# Patient Record
Sex: Male | Born: 1955
Health system: Southern US, Community
[De-identification: ages and names within clinical notes are randomized; demographics above are authoritative.]

## PROBLEM LIST (undated history)

## (undated) DIAGNOSIS — F32A Depression, unspecified: Secondary | ICD-10-CM

## (undated) DIAGNOSIS — F329 Major depressive disorder, single episode, unspecified: Secondary | ICD-10-CM

## (undated) DIAGNOSIS — I10 Essential (primary) hypertension: Secondary | ICD-10-CM

## (undated) DIAGNOSIS — M1990 Unspecified osteoarthritis, unspecified site: Secondary | ICD-10-CM

## (undated) HISTORY — PX: COLON RESECTION: SHX5231

## (undated) HISTORY — PX: JOINT REPLACEMENT: SHX530

## (undated) HISTORY — PX: APPENDECTOMY: SHX54

---

## 2010-05-15 ENCOUNTER — Ambulatory Visit (HOSPITAL_COMMUNITY): Admission: RE | Admit: 2010-05-15 | Discharge: 2010-05-15 | Payer: Self-pay | Admitting: Psychiatry

## 2010-05-20 ENCOUNTER — Other Ambulatory Visit (HOSPITAL_COMMUNITY): Admission: RE | Admit: 2010-05-20 | Discharge: 2010-07-08 | Payer: Self-pay | Admitting: Psychiatry

## 2010-06-27 ENCOUNTER — Ambulatory Visit: Payer: Self-pay | Admitting: Psychiatry

## 2010-12-20 LAB — URINE DRUGS OF ABUSE SCREEN W ALC, ROUTINE (REF LAB)
Barbiturate Quant, Ur: NEGATIVE
Benzodiazepines.: POSITIVE — AB
Cocaine Metabolites: NEGATIVE
Creatinine,U: 77.8 mg/dL
Ethyl Alcohol: <10 mg/dL (ref <10)
Phencyclidine (PCP): NEGATIVE

## 2010-12-20 LAB — BENZODIAZEPINE, QUANTITATIVE, URINE
Nordiazepam GC/MS Conf: NEGATIVE NG/ML
Temazepam GC/MS Conf: NEGATIVE NG/ML

## 2011-04-03 ENCOUNTER — Emergency Department (INDEPENDENT_AMBULATORY_CARE_PROVIDER_SITE_OTHER): Payer: BC Managed Care – PPO

## 2011-04-03 ENCOUNTER — Emergency Department (HOSPITAL_BASED_OUTPATIENT_CLINIC_OR_DEPARTMENT_OTHER)
Admission: EM | Admit: 2011-04-03 | Discharge: 2011-04-03 | Disposition: A | Discharge status: Home / Self Care | Payer: BC Managed Care – PPO | Attending: Emergency Medicine | Admitting: Emergency Medicine

## 2011-04-03 DIAGNOSIS — I10 Essential (primary) hypertension: Secondary | ICD-10-CM | POA: Insufficient documentation

## 2011-04-03 DIAGNOSIS — E119 Type 2 diabetes mellitus without complications: Secondary | ICD-10-CM | POA: Insufficient documentation

## 2011-04-03 DIAGNOSIS — R109 Unspecified abdominal pain: Secondary | ICD-10-CM | POA: Insufficient documentation

## 2011-04-03 DIAGNOSIS — E669 Obesity, unspecified: Secondary | ICD-10-CM | POA: Insufficient documentation

## 2011-04-03 DIAGNOSIS — F3289 Other specified depressive episodes: Secondary | ICD-10-CM | POA: Insufficient documentation

## 2011-04-03 DIAGNOSIS — K7689 Other specified diseases of liver: Secondary | ICD-10-CM

## 2011-04-03 DIAGNOSIS — R1011 Right upper quadrant pain: Secondary | ICD-10-CM

## 2011-04-03 DIAGNOSIS — F329 Major depressive disorder, single episode, unspecified: Secondary | ICD-10-CM | POA: Insufficient documentation

## 2011-04-03 LAB — URINALYSIS, ROUTINE W REFLEX MICROSCOPIC
Bilirubin Urine: NEGATIVE
Glucose, UA: NEGATIVE mg/dL
Hgb urine dipstick: NEGATIVE
Protein, ur: NEGATIVE mg/dL
Urobilinogen, UA: 0.2 mg/dL (ref 0.0–1.0)

## 2011-04-03 LAB — COMPREHENSIVE METABOLIC PANEL
ALT: 27 U/L (ref 0–53)
AST: 23 U/L (ref 0–37)
Alkaline Phosphatase: 80 U/L (ref 39–117)
CO2: 22 mEq/L (ref 19–32)
Chloride: 101 mEq/L (ref 96–112)
GFR calc Af Amer: >60 mL/min (ref >60)
GFR calc non Af Amer: >60 mL/min (ref >60)
Glucose, Bld: 105 mg/dL — ABNORMAL HIGH (ref 70–99)
Potassium: 4 mEq/L (ref 3.5–5.1)
Sodium: 135 mEq/L (ref 135–145)
Total Bilirubin: 0.5 mg/dL (ref 0.3–1.2)

## 2011-04-03 LAB — CBC
Hemoglobin: 16.7 g/dL (ref 13.0–17.0)
MCH: 29.4 pg (ref 26.0–34.0)
Platelets: 213 10*3/uL (ref 150–400)
RBC: 5.68 MIL/uL (ref 4.22–5.81)
WBC: 13.3 10*3/uL — ABNORMAL HIGH (ref 4.0–10.5)

## 2011-04-03 LAB — DIFFERENTIAL
Basophils Absolute: 0 10*3/uL (ref 0.0–0.1)
Basophils Relative: 0 % (ref 0–1)
Eosinophils Absolute: 0.3 10*3/uL (ref 0.0–0.7)
Monocytes Relative: 10 % (ref 3–12)
Neutro Abs: 10.4 10*3/uL — ABNORMAL HIGH (ref 1.7–7.7)
Neutrophils Relative %: 78 % — ABNORMAL HIGH (ref 43–77)

## 2011-04-03 MED ORDER — IOHEXOL 300 MG/ML  SOLN
100.0000 mL | Freq: Once | INTRAMUSCULAR | Status: AC | PRN
  Administered 2011-04-03: 100 mL via INTRAVENOUS

## 2011-08-13 ENCOUNTER — Emergency Department (HOSPITAL_BASED_OUTPATIENT_CLINIC_OR_DEPARTMENT_OTHER)
Admission: EM | Admit: 2011-08-13 | Discharge: 2011-08-13 | Disposition: A | Discharge status: Home / Self Care | Payer: BC Managed Care – PPO | Attending: Emergency Medicine | Admitting: Emergency Medicine

## 2011-08-13 ENCOUNTER — Encounter: Payer: Self-pay | Admitting: *Deleted

## 2011-08-13 DIAGNOSIS — S61209A Unspecified open wound of unspecified finger without damage to nail, initial encounter: Secondary | ICD-10-CM | POA: Insufficient documentation

## 2011-08-13 DIAGNOSIS — X58XXXA Exposure to other specified factors, initial encounter: Secondary | ICD-10-CM | POA: Insufficient documentation

## 2011-08-13 DIAGNOSIS — F17200 Nicotine dependence, unspecified, uncomplicated: Secondary | ICD-10-CM | POA: Insufficient documentation

## 2011-08-13 DIAGNOSIS — S61219A Laceration without foreign body of unspecified finger without damage to nail, initial encounter: Secondary | ICD-10-CM

## 2011-08-13 DIAGNOSIS — I10 Essential (primary) hypertension: Secondary | ICD-10-CM | POA: Insufficient documentation

## 2011-08-13 HISTORY — DX: Essential (primary) hypertension: I10

## 2011-08-13 MED ORDER — TETANUS-DIPHTH-ACELL PERTUSSIS 5-2.5-18.5 LF-MCG/0.5 IM SUSP
INTRAMUSCULAR | Status: AC
  Filled 2011-08-13: qty 0.5

## 2011-08-13 MED ORDER — "THROMBI-PAD 3""X3"" EX PADS"
1.0000 | MEDICATED_PAD | Freq: Once | CUTANEOUS | Status: AC
  Administered 2011-08-13: 1 via TOPICAL

## 2011-08-13 MED ORDER — "THROMBI-PAD 3""X3"" EX PADS"
MEDICATED_PAD | CUTANEOUS | Status: AC
  Filled 2011-08-13: qty 1

## 2011-08-13 MED ORDER — TETANUS-DIPHTH-ACELL PERTUSSIS 5-2.5-18.5 LF-MCG/0.5 IM SUSP
0.5000 mL | Freq: Once | INTRAMUSCULAR | Status: AC
  Administered 2011-08-13: 0.5 mL via INTRAMUSCULAR

## 2011-08-13 MED ORDER — HYDROCODONE-ACETAMINOPHEN 5-325 MG PO TABS: 2.0000 | ORAL_TABLET | ORAL | Status: AC | PRN

## 2011-08-13 NOTE — ED Provider Notes (Signed)
History     CSN: [id number] Arrival date & time: 08/13/2011  6:25 PM   First MD Initiated Contact with Patient 08/13/11 1832      Chief Complaint  Patient presents with  . Laceration    (Consider location/radiation/quality/duration/timing/severity/associated sxs/prior treatment) Patient is a 55 y.o. male presenting with skin laceration. The history is provided by the patient. No language interpreter was used.  Laceration  The incident occurred 6 to 12 hours ago. The laceration is located on the right hand. The laceration is 1 cm in size. The laceration mechanism was a a metal edge. The pain is at a severity of 4/10. The pain is moderate. The pain has been constant since onset. He reports no foreign bodies present. His tetanus status is out of date.  Pt cut tip of finger off with a potato slicer. Pt complains of continued bleeding.  Past Medical History  Diagnosis Date  . Hypertension     Past Surgical History  Procedure Date  . Colon resection   . Appendectomy   . Joint replacement     History reviewed. No pertinent family history.  History  Substance Use Topics  . Smoking status: Current Everyday Smoker -- 1.0 packs/day  . Smokeless tobacco: Not on file  . Alcohol Use: No      Review of Systems  Skin: Positive for wound.  All other systems reviewed and are negative.    Allergies  Morphine and related and Sulfa antibiotics  Home Medications   Current Outpatient Rx  Name Route Sig Dispense Refill  . FLUOXETINE HCL 40 MG PO CAPS Oral Take 40 mg by mouth daily.      . IBUPROFEN 200 MG PO TABS Oral Take 600 mg by mouth every 6 (six) hours as needed. For pain     . LISINOPRIL PO Oral Take 1 tablet by mouth daily.      Marland Kitchen OXYMETAZOLINE HCL 0.05 % NA SOLN Nasal Place 2 sprays into the nose 2 (two) times daily as needed.        BP 154/91  Pulse 73  Temp(Src) 97.9 F (36.6 C) (Oral)  Resp 16  Ht 5\' 11"  (1.803 m)  Wt 350 lb (158.759 kg)  BMI 48.82 kg/m2  SpO2  98%  Physical Exam  Nursing note and vitals reviewed. Constitutional: He appears well-developed and well-nourished.  HENT:  Head: Normocephalic and atraumatic.  Neck: Normal range of motion. Neck supple.  Musculoskeletal: He exhibits tenderness.       Laceration right 3rd finger distal tip avulsed nv and ns intact  Skin: Skin is warm and dry.  Psychiatric: He has a normal mood and affect.    ED Course  Procedures (including critical care time)  Labs Reviewed - No data to display No results found.   No diagnosis found.    MDM  Bleeding stopped with thrombi pad.  Dressing to finger.        Langston Masker, [place] 08/13/11 1929

## 2011-08-13 NOTE — ED Provider Notes (Signed)
Medical screening examination/treatment/procedure(s) were performed by non-physician practitioner and as supervising physician I was immediately available for consultation/collaboration.   Haytham Maher A. Patrica Duel, MD 08/13/11 4132

## 2011-08-13 NOTE — ED Notes (Signed)
Pt c/o laceration to right 3rd finger by  Metal cheese grader.

## 2011-08-13 NOTE — ED Notes (Signed)
Dressed right 3rd finger with sterile non stick bulky dressing

## 2012-05-27 IMAGING — CT CT ABD-PELV W/ CM
2 of 5 series · 17 of 46 positions shown, 19 images · IV contrast (APPLIED)
Comparison: None

CLINICAL DATA: Right upper quadrant pain.

CT ABDOMEN AND PELVIS WITH CONTRAST
TECHNIQUE: Multidetector CT imaging of the abdomen and pelvis was
performed following the standard protocol during bolus
administration of intravenous contrast.
Contrast: 100 ml Omnipaque 300 IV.

[Series 2: abd/pelvis 5.0 b31f · axial · 0.98mm/px · z∈[-317,+123]mm · 14 of 100 slices shown, 16 images]
[im 6/100  soft-tissue]
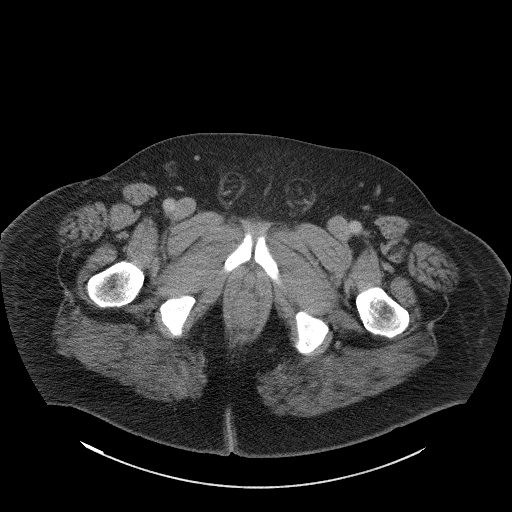
[im 6/100  bone]
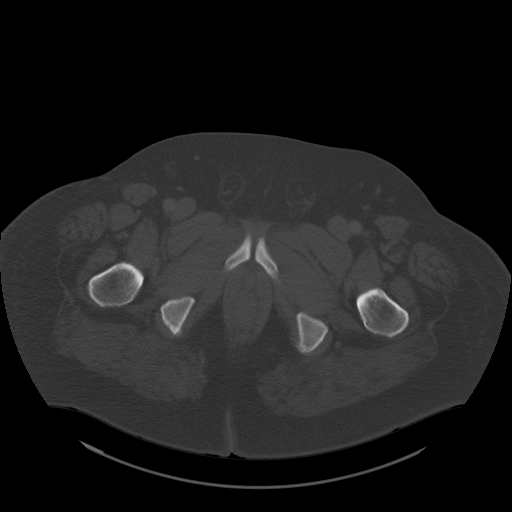
[im 12/100  soft-tissue]
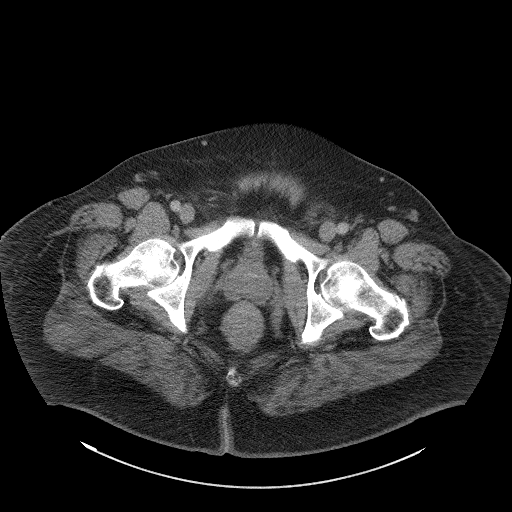
[im 23/100  soft-tissue]
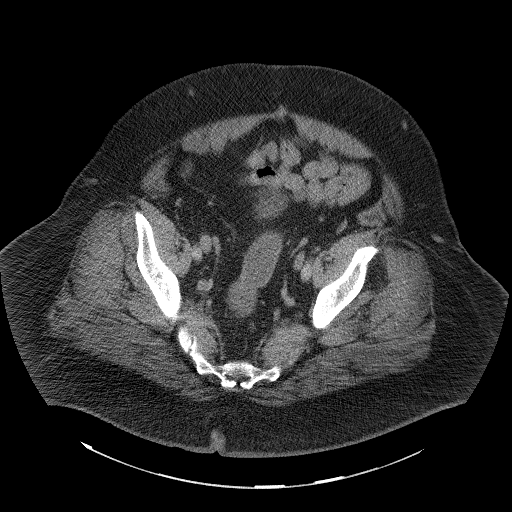
[im 28/100  soft-tissue]
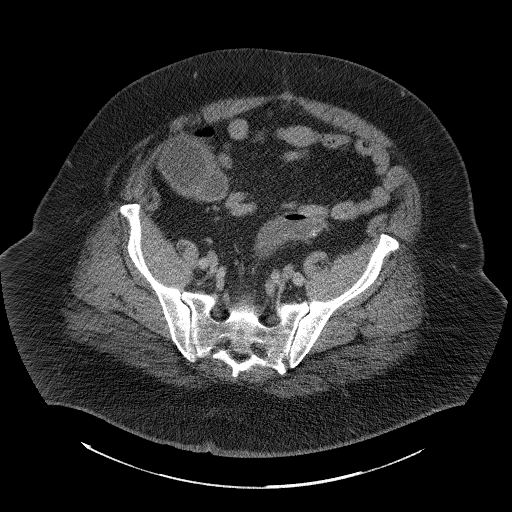
[im 34/100  soft-tissue]
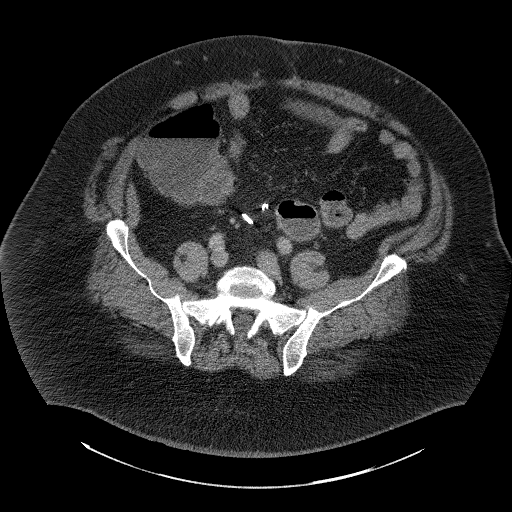
[im 39/100  soft-tissue]
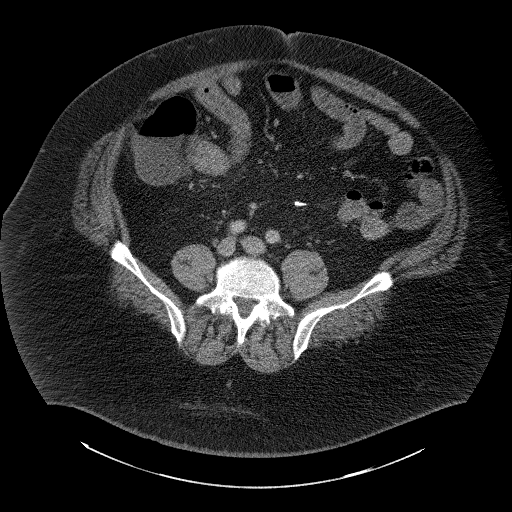
[im 45/100  soft-tissue]
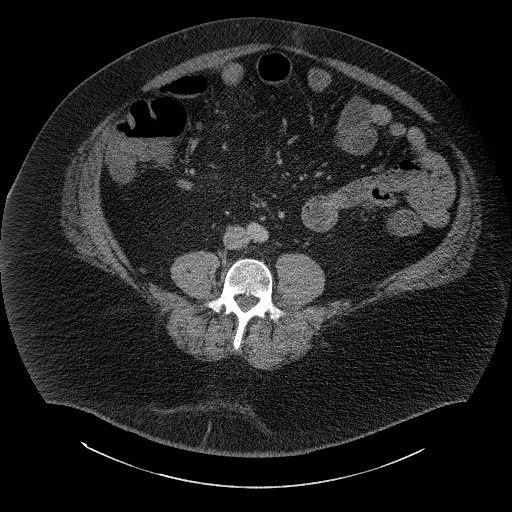
[im 56/100  soft-tissue]
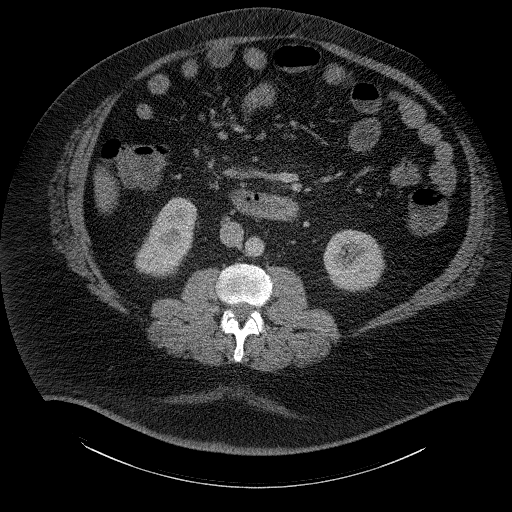
[im 61/100  soft-tissue]
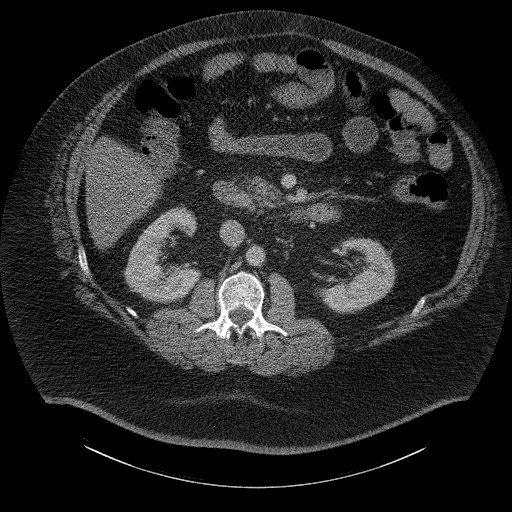
[im 61/100  bone]
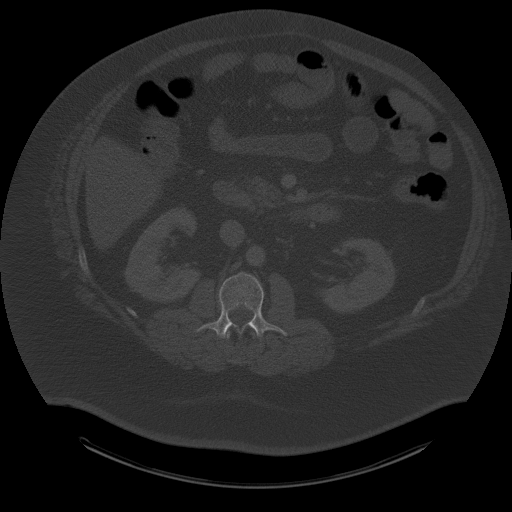
[im 67/100  soft-tissue]
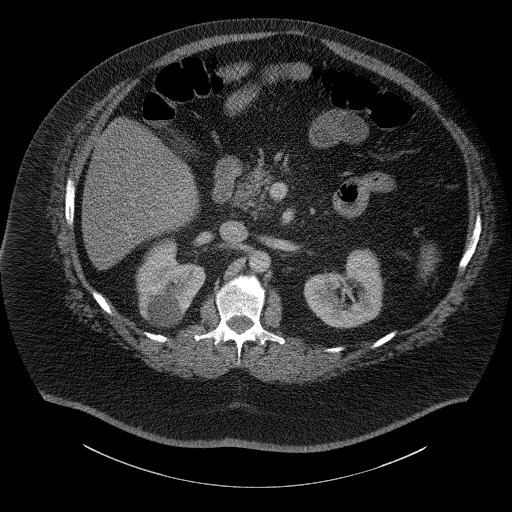
[im 72/100  soft-tissue]
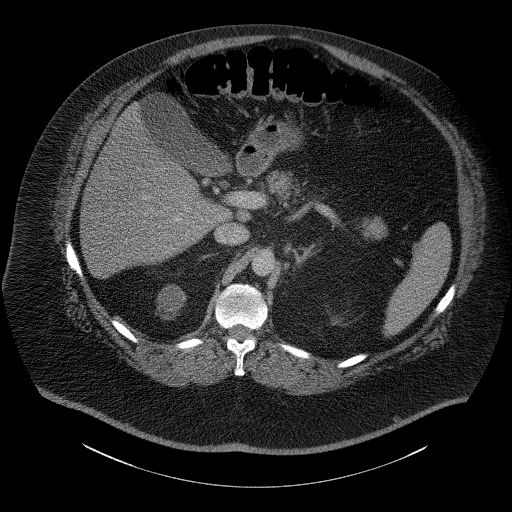
[im 78/100  soft-tissue]
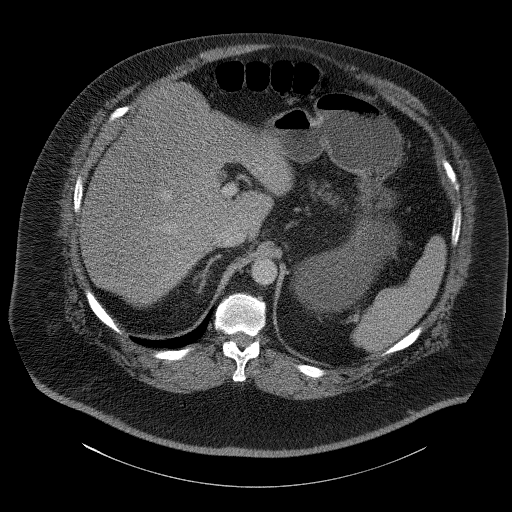
[im 89/100  soft-tissue]
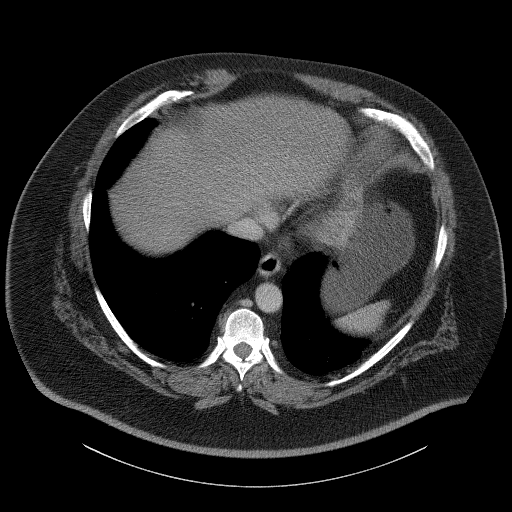
[im 94/100  soft-tissue]
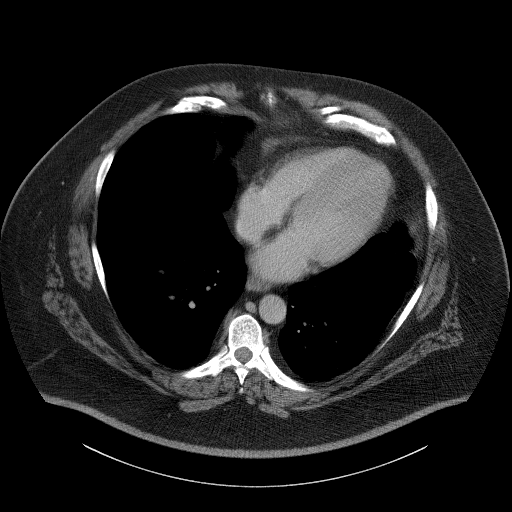

[Series 5: abd/pelvis 3.0 coronal · coronal · 1.04mm/px · 3 of 132 slices shown]
[im 44/132  soft-tissue]
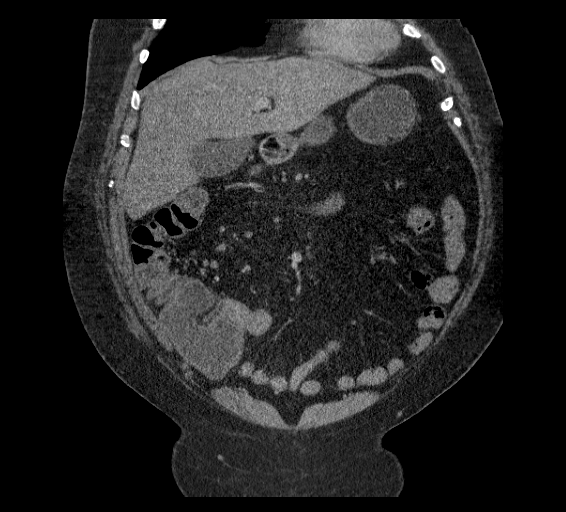
[im 59/132  soft-tissue]
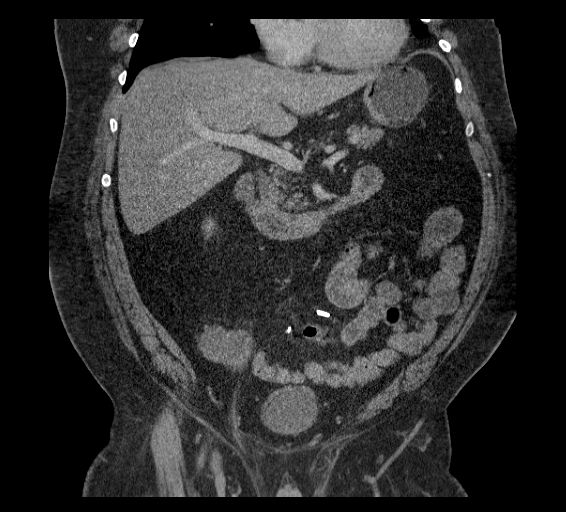
[im 73/132  soft-tissue]
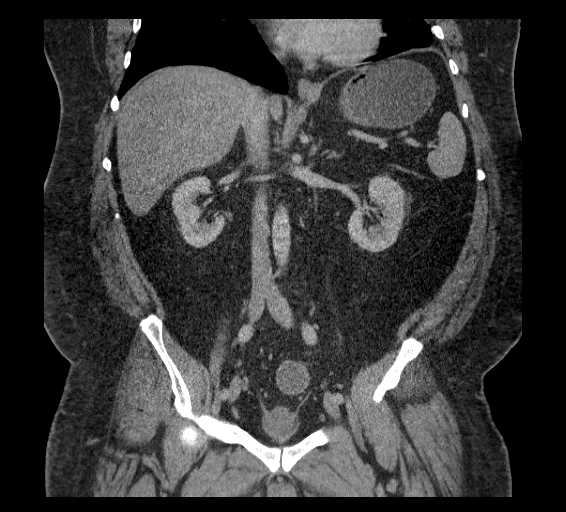

[17 of 46 positions shown; findings below may reference images not displayed]

FINDINGS: Linear densities in the left lung base, scarring versus
subsegmental atelectasis.  No pleural effusions.  Heart is normal
size.

There is diffuse fatty infiltration of the liver.  Gallbladder,
spleen, pancreas, adrenals, left kidney unremarkable.  Benign-
appearing cyst in the upper pole of the right kidney.

Prior surgery within the sigmoid colon.  Prior appendectomy.  Small
bowel is unremarkable.  There are scattered small and borderline
sized mesenteric lymph nodes, best seen in the right abdomen.  No
free fluid or free air.  Aorta is normal caliber.

Urinary bladder unremarkable.  No acute bony abnormality.
IMPRESSION: Mild fatty infiltration of the liver.

Right upper pole renal cysts which appear benign.

Mild prominent right abdominal mesenteric lymph nodes may reflect
mesenteric adenitis.

Prior appendectomy.

## 2014-08-08 ENCOUNTER — Emergency Department (HOSPITAL_BASED_OUTPATIENT_CLINIC_OR_DEPARTMENT_OTHER)
Admission: EM | Admit: 2014-08-08 | Discharge: 2014-08-08 | Disposition: A | Discharge status: Home / Self Care | Payer: BC Managed Care – PPO | Attending: Emergency Medicine | Admitting: Emergency Medicine

## 2014-08-08 ENCOUNTER — Emergency Department (HOSPITAL_BASED_OUTPATIENT_CLINIC_OR_DEPARTMENT_OTHER): Payer: BC Managed Care – PPO

## 2014-08-08 ENCOUNTER — Encounter (HOSPITAL_BASED_OUTPATIENT_CLINIC_OR_DEPARTMENT_OTHER): Payer: Self-pay | Admitting: Emergency Medicine

## 2014-08-08 DIAGNOSIS — Z79899 Other long term (current) drug therapy: Secondary | ICD-10-CM | POA: Diagnosis not present

## 2014-08-08 DIAGNOSIS — I10 Essential (primary) hypertension: Secondary | ICD-10-CM | POA: Diagnosis not present

## 2014-08-08 DIAGNOSIS — Z8739 Personal history of other diseases of the musculoskeletal system and connective tissue: Secondary | ICD-10-CM | POA: Diagnosis not present

## 2014-08-08 DIAGNOSIS — K5732 Diverticulitis of large intestine without perforation or abscess without bleeding: Secondary | ICD-10-CM

## 2014-08-08 DIAGNOSIS — F329 Major depressive disorder, single episode, unspecified: Secondary | ICD-10-CM | POA: Insufficient documentation

## 2014-08-08 DIAGNOSIS — R1031 Right lower quadrant pain: Secondary | ICD-10-CM | POA: Diagnosis not present

## 2014-08-08 DIAGNOSIS — R1032 Left lower quadrant pain: Secondary | ICD-10-CM | POA: Diagnosis not present

## 2014-08-08 DIAGNOSIS — Z87891 Personal history of nicotine dependence: Secondary | ICD-10-CM | POA: Insufficient documentation

## 2014-08-08 HISTORY — DX: Depression, unspecified: F32.A

## 2014-08-08 HISTORY — DX: Unspecified osteoarthritis, unspecified site: M19.90

## 2014-08-08 HISTORY — DX: Major depressive disorder, single episode, unspecified: F32.9

## 2014-08-08 LAB — CBC WITH DIFFERENTIAL/PLATELET
BASOS ABS: 0.1 10*3/uL (ref 0.0–0.1)
BASOS PCT: 0 % (ref 0–1)
Eosinophils Absolute: 0.4 10*3/uL (ref 0.0–0.7)
Eosinophils Relative: 2 % (ref 0–5)
HCT: 48 % (ref 39.0–52.0)
HEMOGLOBIN: 17.1 g/dL — AB (ref 13.0–17.0)
Lymphocytes Relative: 20 % (ref 12–46)
Lymphs Abs: 3.2 10*3/uL (ref 0.7–4.0)
MCH: 29.9 pg (ref 26.0–34.0)
MCHC: 35.6 g/dL (ref 30.0–36.0)
MCV: 83.9 fL (ref 78.0–100.0)
MONOS PCT: 9 % (ref 3–12)
Monocytes Absolute: 1.4 10*3/uL — ABNORMAL HIGH (ref 0.1–1.0)
NEUTROS ABS: 11.3 10*3/uL — AB (ref 1.7–7.7)
NEUTROS PCT: 69 % (ref 43–77)
Platelets: 223 10*3/uL (ref 150–400)
RBC: 5.72 MIL/uL (ref 4.22–5.81)
RDW: 13.9 % (ref 11.5–15.5)
WBC: 16.4 10*3/uL — ABNORMAL HIGH (ref 4.0–10.5)

## 2014-08-08 LAB — COMPREHENSIVE METABOLIC PANEL
ALBUMIN: 3.7 g/dL (ref 3.5–5.2)
ALK PHOS: 71 U/L (ref 39–117)
ALT: 18 U/L (ref 0–53)
AST: 17 U/L (ref 0–37)
Anion gap: 14 (ref 5–15)
BILIRUBIN TOTAL: 0.6 mg/dL (ref 0.3–1.2)
BUN: 20 mg/dL (ref 6–23)
CHLORIDE: 104 meq/L (ref 96–112)
CO2: 19 mEq/L (ref 19–32)
Calcium: 9.1 mg/dL (ref 8.4–10.5)
Creatinine, Ser: 0.9 mg/dL (ref 0.50–1.35)
GFR calc Af Amer: >90 mL/min (ref >90)
GFR calc non Af Amer: >90 mL/min (ref >90)
Glucose, Bld: 99 mg/dL (ref 70–99)
POTASSIUM: 4.1 meq/L (ref 3.7–5.3)
SODIUM: 137 meq/L (ref 137–147)
Total Protein: 6.6 g/dL (ref 6.0–8.3)

## 2014-08-08 LAB — URINALYSIS, ROUTINE W REFLEX MICROSCOPIC
BILIRUBIN URINE: NEGATIVE
GLUCOSE, UA: NEGATIVE mg/dL
HGB URINE DIPSTICK: NEGATIVE
Ketones, ur: NEGATIVE mg/dL
Leukocytes, UA: NEGATIVE
Nitrite: NEGATIVE
PH: 5 (ref 5.0–8.0)
Protein, ur: NEGATIVE mg/dL
SPECIFIC GRAVITY, URINE: 1.017 (ref 1.005–1.030)
Urobilinogen, UA: 0.2 mg/dL (ref 0.0–1.0)

## 2014-08-08 LAB — LIPASE, BLOOD: Lipase: 32 U/L (ref 11–59)

## 2014-08-08 MED ORDER — HYDROCODONE-ACETAMINOPHEN 5-325 MG PO TABS: 1.0000 | ORAL_TABLET | Freq: Four times a day (QID) | ORAL | Status: AC | PRN

## 2014-08-08 MED ORDER — IOHEXOL 300 MG/ML  SOLN
50.0000 mL | Freq: Once | INTRAMUSCULAR | Status: AC | PRN
  Administered 2014-08-08: 50 mL via ORAL

## 2014-08-08 MED ORDER — CIPROFLOXACIN HCL 500 MG PO TABS: 500.0000 mg | ORAL_TABLET | Freq: Two times a day (BID) | ORAL | Status: AC

## 2014-08-08 MED ORDER — METRONIDAZOLE 500 MG PO TABS: 500.0000 mg | ORAL_TABLET | Freq: Two times a day (BID) | ORAL | Status: AC

## 2014-08-08 MED ORDER — IOHEXOL 300 MG/ML  SOLN
100.0000 mL | Freq: Once | INTRAMUSCULAR | Status: AC | PRN
  Administered 2014-08-08: 100 mL via INTRAVENOUS

## 2014-08-08 NOTE — Discharge Instructions (Signed)

## 2014-08-08 NOTE — ED Notes (Signed)
abd pain since Sunday night getting worse in the last 12 hrs

## 2014-08-08 NOTE — ED Provider Notes (Addendum)
CSN: [id number]     Arrival date & time 08/08/14  0443 History   First MD Initiated Contact with Patient 08/08/14 0500     Chief Complaint  Patient presents with  . Abdominal Pain     (Consider location/radiation/quality/duration/timing/severity/associated sxs/prior Treatment) HPI Comments: Pain is now a 7/10 constantly and occasionally will escalate to 9/10 for a few min then return to 7/10.  Feels a little like his diverticulitis in the past  Patient is a 58 y.o. male presenting with abdominal pain. The history is provided by the patient.  Abdominal Pain Pain location:  RLQ and LLQ Pain quality: aching, cramping, sharp and shooting   Pain radiates to:  Does not radiate Pain severity:  Moderate Onset quality:  Gradual Duration:  36 hours Timing:  Constant Progression:  Worsening Chronicity:  New Context comment:  Noticed it started sunday night in the RLQ and then worsened last night and moved to include the left lower quardrant Relieved by:  Nothing Worsened by:  Nothing tried Ineffective treatments:  Eating, lying down, movement, not moving, NSAIDs and urination Associated symptoms: no anorexia, no chest pain, no chills, no cough, no diarrhea, no dysuria, no fever, no nausea, no shortness of breath and no vomiting   Risk factors: multiple surgeries   Risk factors: no NSAID use   Risk factors comment:  Status post colon resection for diverticulitis, appendectomy   Past Medical History  Diagnosis Date  . Hypertension   . Arthritis   . Depression    Past Surgical History  Procedure Laterality Date  . Colon resection    . Appendectomy    . Joint replacement     History reviewed. No pertinent family history. History  Substance Use Topics  . Smoking status: Former Smoker -- 1.00 packs/day  . Smokeless tobacco: Not on file  . Alcohol Use: No    Review of Systems  Constitutional: Negative for fever and chills.  Respiratory: Negative for cough and shortness of breath.    Cardiovascular: Negative for chest pain.  Gastrointestinal: Positive for abdominal pain. Negative for nausea, vomiting, diarrhea and anorexia.  Genitourinary: Negative for dysuria.  All other systems reviewed and are negative.     Allergies  Morphine and related and Sulfa antibiotics  Home Medications   Prior to Admission medications   Medication Sig Start Date End Date Taking? Authorizing Provider  DULoxetine HCl (CYMBALTA PO) Take by mouth.   Yes Historical Provider, MD  FLUoxetine (PROZAC) 40 MG capsule Take 40 mg by mouth daily.      Historical Provider, MD  ibuprofen (ADVIL,MOTRIN) 200 MG tablet Take 600 mg by mouth every 6 (six) hours as needed. For pain     Historical Provider, MD  LISINOPRIL PO Take 1 tablet by mouth daily.      Historical Provider, MD  oxymetazoline (AFRIN) 0.05 % nasal spray Place 2 sprays into the nose 2 (two) times daily as needed.      Historical Provider, MD   BP 147/95 mmHg  Pulse 102  Temp(Src) 97.6 F (36.4 C) (Oral)  Resp 20  Ht 5\' 11"  (1.803 m)  Wt 295 lb (133.811 kg)  BMI 41.16 kg/m2  SpO2 100% Physical Exam  Constitutional: He is oriented to person, place, and time. He appears well-developed and well-nourished. No distress.  obese  HENT:  Head: Normocephalic and atraumatic.  Mouth/Throat: Oropharynx is clear and moist.  Eyes: Conjunctivae and EOM are normal. Pupils are equal, round, and reactive to light.  Neck:  Normal range of motion. Neck supple.  Cardiovascular: Regular rhythm and intact distal pulses.  Tachycardia present.   Murmur heard.  Systolic murmur is present with a grade of 2/6  Pulmonary/Chest: Effort normal and breath sounds normal. No respiratory distress. He has no wheezes. He has no rales.  Abdominal: Soft. He exhibits no distension. There is tenderness in the right lower quadrant and left lower quadrant. There is no rebound, no guarding and no CVA tenderness.  Well healed midline abd scar with soft reducible ventral  hernia proximal to the umbilicus  Musculoskeletal: Normal range of motion. He exhibits no edema or tenderness.  Neurological: He is alert and oriented to person, place, and time.  Skin: Skin is warm and dry. No rash noted. No erythema.  Psychiatric: He has a normal mood and affect. His behavior is normal.  Nursing note and vitals reviewed.   ED Course  Procedures (including critical care time) Labs Review Labs Reviewed  CBC WITH DIFFERENTIAL - Abnormal; Notable for the following:    WBC 16.4 (*)    Hemoglobin 17.1 (*)    Neutro Abs 11.3 (*)    Monocytes Absolute 1.4 (*)    All other components within normal limits  COMPREHENSIVE METABOLIC PANEL  LIPASE, BLOOD  URINALYSIS, ROUTINE W REFLEX MICROSCOPIC    Imaging Review Ct Abdomen Pelvis W Contrast  08/08/2014   CLINICAL DATA:  Nausea, vomiting, and abdominal pain. History of diverticulitis with portion of colon removed.  EXAM: CT ABDOMEN AND PELVIS WITH CONTRAST  TECHNIQUE: Multidetector CT imaging of the abdomen and pelvis was performed using the standard protocol following bolus administration of intravenous contrast.  CONTRAST:  50mL OMNIPAQUE IOHEXOL 300 MG/ML SOLN, OMNIPAQUE IOHEXOL 300 MG/ML SOLN  COMPARISON:  04/03/2011  FINDINGS: Lung bases are clear.  The liver, spleen, gallbladder, pancreas, adrenal glands, abdominal aorta, inferior vena cava, and retroperitoneal lymph nodes are unremarkable. Cyst in the upper pole of the right kidney is stable since previous study. No solid mass or hydronephrosis in either kidney. Stomach and small bowel are decompressed. Stool-filled colon without distention. There is a focal area of apparent wall thickening in the mid transverse colon with mild pericolonic stranding. There is an adjacent colonic diverticulum here. This could represent localized transverse colon diverticulitis or it could indicate an underlying mass lesion. Followup with colonoscopy after resolution of acute process is  suggested. No free air or free fluid in the abdomen. Prominent visceral adipose tissues.  Pelvis: Postoperative changes with surgical anastomosis in the sigmoid colon and surgical clips in the adjacent fat. Prostate gland is not enlarged. Bladder is decompressed. No free or loculated pelvic fluid collections. No pelvic mass or lymphadenopathy. No evidence of diverticulitis. Appendix is not identified but no inflammatory changes are suggested in the right lower quadrant. Degenerative changes in the lumbar spine. No destructive bone lesions appreciated.  IMPRESSION: Focal mass versus inflammatory wall thickening in the mid transverse colon. Findings could represent transverse colonic diverticulitis. Follow-up colonoscopy after resolution of acute process is suggested to exclude underlying mass lesion.   Electronically Signed   By: Burman Nieves M.D.   On: 08/08/2014 06:37     EKG Interpretation None      MDM   Final diagnoses:  Right lower quadrant pain  Diverticulitis of large intestine without perforation or abscess without bleeding    Patient with a history of diverticulitis status post colon resection and appendectomy who presents with 36 hours of worsening lower abdominal pain. Infectious symptoms, urinary  or genital complaints. Low suspicion for kidney stoneand UTI at this time based on patient's story. Concern for recurrent diverticulitis. Patient does not complain of symptoms concerning for obstruction.  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 At this time he is refusing pain medication.  CBC, CMP, UA, lipase, CT abdomen/pelvis pending.  6:18 AM Pt found to have leukocytosis of 16,000 but normal CMP and lipase and UA.  Ct pending.  6:42 AM CT showed findings of possible acute diverticulitis which would be most consistent with patient story. Patient does have a GI specialist and his last colonoscopy was 2 years ago. We'll treat with Cipro and Flagyl and have patient  follow-up with his gastroenterologist for repeat colonoscopy in the future  Gwyneth SproutWhitney Kikue Gerhart, MD 08/08/14 [id number]  Gwyneth SproutWhitney Loreen Bankson, MD 08/08/14 [phone]

## 2014-09-03 ENCOUNTER — Emergency Department (HOSPITAL_BASED_OUTPATIENT_CLINIC_OR_DEPARTMENT_OTHER): Payer: BC Managed Care – PPO

## 2014-09-03 ENCOUNTER — Encounter (HOSPITAL_BASED_OUTPATIENT_CLINIC_OR_DEPARTMENT_OTHER): Payer: Self-pay | Admitting: Emergency Medicine

## 2014-09-03 ENCOUNTER — Emergency Department (HOSPITAL_BASED_OUTPATIENT_CLINIC_OR_DEPARTMENT_OTHER)
Admission: EM | Admit: 2014-09-03 | Discharge: 2014-09-03 | Disposition: A | Discharge status: Home / Self Care | Payer: BC Managed Care – PPO | Attending: Emergency Medicine | Admitting: Emergency Medicine

## 2014-09-03 DIAGNOSIS — I10 Essential (primary) hypertension: Secondary | ICD-10-CM | POA: Diagnosis not present

## 2014-09-03 DIAGNOSIS — R011 Cardiac murmur, unspecified: Secondary | ICD-10-CM | POA: Diagnosis not present

## 2014-09-03 DIAGNOSIS — Z87891 Personal history of nicotine dependence: Secondary | ICD-10-CM | POA: Insufficient documentation

## 2014-09-03 DIAGNOSIS — D72829 Elevated white blood cell count, unspecified: Secondary | ICD-10-CM | POA: Insufficient documentation

## 2014-09-03 DIAGNOSIS — R51 Headache: Secondary | ICD-10-CM | POA: Diagnosis present

## 2014-09-03 DIAGNOSIS — F329 Major depressive disorder, single episode, unspecified: Secondary | ICD-10-CM | POA: Diagnosis not present

## 2014-09-03 DIAGNOSIS — R0981 Nasal congestion: Secondary | ICD-10-CM | POA: Diagnosis not present

## 2014-09-03 DIAGNOSIS — R2240 Localized swelling, mass and lump, unspecified lower limb: Secondary | ICD-10-CM | POA: Insufficient documentation

## 2014-09-03 DIAGNOSIS — R519 Headache, unspecified: Secondary | ICD-10-CM

## 2014-09-03 DIAGNOSIS — Z79899 Other long term (current) drug therapy: Secondary | ICD-10-CM | POA: Insufficient documentation

## 2014-09-03 DIAGNOSIS — R0789 Other chest pain: Secondary | ICD-10-CM | POA: Diagnosis not present

## 2014-09-03 DIAGNOSIS — M1990 Unspecified osteoarthritis, unspecified site: Secondary | ICD-10-CM | POA: Insufficient documentation

## 2014-09-03 DIAGNOSIS — Z792 Long term (current) use of antibiotics: Secondary | ICD-10-CM | POA: Insufficient documentation

## 2014-09-03 LAB — CBC WITH DIFFERENTIAL/PLATELET
BASOS ABS: 0.1 10*3/uL (ref 0.0–0.1)
BASOS PCT: 1 % (ref 0–1)
Eosinophils Absolute: 0.4 10*3/uL (ref 0.0–0.7)
Eosinophils Relative: 3 % (ref 0–5)
HCT: 43.4 % (ref 39.0–52.0)
Hemoglobin: 14.6 g/dL (ref 13.0–17.0)
LYMPHS PCT: 22 % (ref 12–46)
Lymphs Abs: 2.9 10*3/uL (ref 0.7–4.0)
MCH: 29.6 pg (ref 26.0–34.0)
MCHC: 33.6 g/dL (ref 30.0–36.0)
MCV: 88 fL (ref 78.0–100.0)
Monocytes Absolute: 1.1 10*3/uL — ABNORMAL HIGH (ref 0.1–1.0)
Monocytes Relative: 8 % (ref 3–12)
NEUTROS ABS: 8.8 10*3/uL — AB (ref 1.7–7.7)
Neutrophils Relative %: 66 % (ref 43–77)
PLATELETS: 178 10*3/uL (ref 150–400)
RBC: 4.93 MIL/uL (ref 4.22–5.81)
RDW: 14.8 % (ref 11.5–15.5)
WBC: 13.2 10*3/uL — AB (ref 4.0–10.5)

## 2014-09-03 LAB — BASIC METABOLIC PANEL
ANION GAP: 13 (ref 5–15)
BUN: 18 mg/dL (ref 6–23)
CALCIUM: 8.6 mg/dL (ref 8.4–10.5)
CHLORIDE: 104 meq/L (ref 96–112)
CO2: 22 mEq/L (ref 19–32)
Creatinine, Ser: 0.9 mg/dL (ref 0.50–1.35)
GFR calc Af Amer: >90 mL/min (ref >90)
GFR calc non Af Amer: >90 mL/min (ref >90)
Glucose, Bld: 85 mg/dL (ref 70–99)
Potassium: 4.1 mEq/L (ref 3.7–5.3)
Sodium: 139 mEq/L (ref 137–147)

## 2014-09-03 MED ORDER — DIPHENHYDRAMINE HCL 50 MG/ML IJ SOLN
25.0000 mg | Freq: Once | INTRAMUSCULAR | Status: AC
  Administered 2014-09-03: 25 mg via INTRAVENOUS
  Filled 2014-09-03: qty 1

## 2014-09-03 MED ORDER — SODIUM CHLORIDE 0.9 % IV BOLUS (SEPSIS)
1000.0000 mL | Freq: Once | INTRAVENOUS | Status: AC
  Administered 2014-09-03: 1000 mL via INTRAVENOUS

## 2014-09-03 MED ORDER — METOCLOPRAMIDE HCL 5 MG/ML IJ SOLN
10.0000 mg | Freq: Once | INTRAMUSCULAR | Status: AC
  Administered 2014-09-03: 10 mg via INTRAVENOUS
  Filled 2014-09-03: qty 2

## 2014-09-03 NOTE — ED Provider Notes (Signed)
CSN: [id number]     Arrival date & time 09/03/14  1522 History  This chart was scribed for Wayne FossaElizabeth Darnice Comrie, MD by Wayne Walsh, ED Scribe. This patient was seen in room MH08/MH08 and the patient's care was started at 5:31 PM.   Chief Complaint  Patient presents with  . Headache  . Dizziness  . Nausea   Patient is a 58 y.o. male presenting with headaches and dizziness. The history is provided by the patient. No language interpreter was used.  Headache Pain location:  Generalized Quality:  Unable to specify Radiates to:  Does not radiate Onset quality:  Sudden Duration:  1 day Timing:  Constant Progression:  Waxing and waning Chronicity:  New Similar to prior headaches: no   Relieved by:  Nothing Worsened by:  Nothing tried Ineffective treatments:  None tried Associated symptoms: congestion, dizziness and nausea   Associated symptoms: no neck stiffness  Tingling: in fingers of hands bilaterally.   Dizziness Associated symptoms: headaches and nausea    HPI Comments: Wayne Walsh is a 58 y.o. male with a history of hypertension, arthritis, and depression, who presents to the Emergency Department complaining of sudden moderate headache, dizziness, nausea that began at 11:30AM earlier today. He describes his headache to be generalized and constant that waxes and wanes. He states he currently feels the most pain on the top of his head. He denies associated fever, emesis, numbness/weakness or edema of extremities. He also reports "chest congestion" that began 2 weeks ago. He currently denies chest pain. He currently takes lisinopril for hypertension. Patient was taken off Metformin 1 month ago.  Past Medical History  Diagnosis Date  . Hypertension   . Arthritis   . Depression    Past Surgical History  Procedure Laterality Date  . Colon resection    . Appendectomy    . Joint replacement     No family history on file. History  Substance Use Topics  . Smoking status: Former  Smoker -- 1.00 packs/day  . Smokeless tobacco: Not on file  . Alcohol Use: No   Review of Systems  HENT: Positive for congestion.   Respiratory: Positive for chest tightness.   Gastrointestinal: Positive for nausea.  Musculoskeletal: Negative for neck stiffness.  Neurological: Positive for dizziness and headaches.  All other systems reviewed and are negative.  Allergies  Morphine and related and Sulfa antibiotics  Home Medications   Prior to Admission medications   Medication Sig Start Date End Date Taking? Authorizing Provider  ciprofloxacin (CIPRO) 500 MG tablet Take 1 tablet (500 mg total) by mouth 2 (two) times daily. 08/08/14   Gwyneth SproutWhitney Plunkett, MD  DULoxetine HCl (CYMBALTA PO) Take by mouth.    Historical Provider, MD  FLUoxetine (PROZAC) 40 MG capsule Take 40 mg by mouth daily.      Historical Provider, MD  HYDROcodone-acetaminophen (NORCO/VICODIN) 5-325 MG per tablet Take 1-2 tablets by mouth every 6 (six) hours as needed for moderate pain or severe pain. 08/08/14   Gwyneth SproutWhitney Plunkett, MD  ibuprofen (ADVIL,MOTRIN) 200 MG tablet Take 600 mg by mouth every 6 (six) hours as needed. For pain     Historical Provider, MD  LISINOPRIL PO Take 1 tablet by mouth daily.      Historical Provider, MD  metroNIDAZOLE (FLAGYL) 500 MG tablet Take 1 tablet (500 mg total) by mouth 2 (two) times daily. 08/08/14   Gwyneth SproutWhitney Plunkett, MD  oxymetazoline (AFRIN) 0.05 % nasal spray Place 2 sprays into the nose 2 (two) times daily  as needed.      Historical Provider, MD   Triage Vitals: BP 146/89 mmHg  Pulse 69  Temp(Src) 98 F (36.7 C) (Oral)  Resp 19  Ht 5\' 11"  (1.803 m)  Wt 300 lb (136.079 kg)  BMI 41.86 kg/m2  SpO2 99%  Physical Exam  Constitutional: He is oriented to person, place, and time. He appears well-developed and well-nourished.  HENT:  Head: Normocephalic and atraumatic.  Right Ear: External ear normal.  Left Ear: External ear normal.  TMs obscured by cerumen  Eyes: Conjunctivae and  EOM are normal. Pupils are equal, round, and reactive to light.  Cardiovascular: Normal rate.   No murmur heard. Systolic ejection murmur noted.     Pulmonary/Chest: Effort normal. No respiratory distress.  Abdominal: Soft. There is no tenderness. There is no rebound and no guarding.  Musculoskeletal: He exhibits edema. He exhibits no tenderness.  Trace pending edema in lower extremities.  Neurological: He is alert and oriented to person, place, and time. No cranial nerve deficit.  Mae symmetrically  Skin: Skin is warm and dry.  Psychiatric: He has a normal mood and affect. His behavior is normal.  Nursing note and vitals reviewed.  ED Course  Procedures (including critical care time)  DIAGNOSTIC STUDIES: Oxygen Saturation is 99% on RA, normal by my interpretation.    COORDINATION OF CARE: 5:36 PM- Discussed plans to obtain diagnostic imaging of head, CXR and EKG. Will order diagnostic lab work. Will give patient Reglan, Benadryl and IV fluids. Pt advised of plan for treatment and pt agrees.  7:23 PM- Patient feels improved upon recheck.  Labs Review Labs Reviewed - No data to display No results found.  Imaging Review  EKG Interpretation None     MDM   Final diagnoses:  Nonintractable headache, unspecified chronicity pattern, unspecified headache type    Patient is here for evaluation of headache. History and presentation not consistent with subarachnoid hemorrhage, meningitis, CVA. CBC with mild leukocytosis which has been present on multiple prior CBCs. In terms of cough/congestion-critical picture not consistent with CHF, ACS, PE, pneumonia. Discussed with patient home care for headache as well as PCP follow-up and return precautions.  I personally performed the services described in this documentation, which was scribed in my presence. The recorded information has been reviewed and is accurate.  Wayne FossaElizabeth Kalyssa Anker, MD 09/03/14 2255

## 2014-09-03 NOTE — ED Notes (Signed)
Pt presents to ED with complaints of headache, dizziness, and nausea and chest congestion.

## 2014-09-03 NOTE — Discharge Instructions (Signed)

## 2014-09-03 NOTE — ED Notes (Signed)
MD at bedside. 

## 2016-10-24 DIAGNOSIS — F1410 Cocaine abuse, uncomplicated: Secondary | ICD-10-CM | POA: Diagnosis not present

## 2016-10-24 DIAGNOSIS — F39 Unspecified mood [affective] disorder: Secondary | ICD-10-CM | POA: Diagnosis not present

## 2016-10-28 DIAGNOSIS — I10 Essential (primary) hypertension: Secondary | ICD-10-CM | POA: Diagnosis not present

## 2016-10-28 DIAGNOSIS — G8929 Other chronic pain: Secondary | ICD-10-CM | POA: Diagnosis not present

## 2016-10-28 DIAGNOSIS — E782 Mixed hyperlipidemia: Secondary | ICD-10-CM | POA: Diagnosis not present

## 2016-10-28 DIAGNOSIS — E119 Type 2 diabetes mellitus without complications: Secondary | ICD-10-CM | POA: Diagnosis not present

## 2016-10-28 DIAGNOSIS — Z6841 Body Mass Index (BMI) 40.0 and over, adult: Secondary | ICD-10-CM | POA: Diagnosis not present

## 2016-10-28 DIAGNOSIS — M791 Myalgia: Secondary | ICD-10-CM | POA: Diagnosis not present

## 2016-10-28 DIAGNOSIS — G4733 Obstructive sleep apnea (adult) (pediatric): Secondary | ICD-10-CM | POA: Diagnosis not present

## 2016-10-28 DIAGNOSIS — K219 Gastro-esophageal reflux disease without esophagitis: Secondary | ICD-10-CM | POA: Diagnosis not present

## 2017-02-03 DIAGNOSIS — I341 Nonrheumatic mitral (valve) prolapse: Secondary | ICD-10-CM | POA: Diagnosis not present

## 2017-02-03 DIAGNOSIS — Z6841 Body Mass Index (BMI) 40.0 and over, adult: Secondary | ICD-10-CM | POA: Diagnosis not present

## 2017-02-03 DIAGNOSIS — G4733 Obstructive sleep apnea (adult) (pediatric): Secondary | ICD-10-CM | POA: Diagnosis not present

## 2017-02-03 DIAGNOSIS — E782 Mixed hyperlipidemia: Secondary | ICD-10-CM | POA: Diagnosis not present

## 2017-02-03 DIAGNOSIS — I10 Essential (primary) hypertension: Secondary | ICD-10-CM | POA: Diagnosis not present

## 2017-02-03 DIAGNOSIS — E119 Type 2 diabetes mellitus without complications: Secondary | ICD-10-CM | POA: Diagnosis not present

## 2017-02-03 DIAGNOSIS — K219 Gastro-esophageal reflux disease without esophagitis: Secondary | ICD-10-CM | POA: Diagnosis not present

## 2017-02-18 DIAGNOSIS — E782 Mixed hyperlipidemia: Secondary | ICD-10-CM | POA: Diagnosis not present

## 2017-06-12 DIAGNOSIS — M25532 Pain in left wrist: Secondary | ICD-10-CM | POA: Diagnosis not present

## 2017-06-12 DIAGNOSIS — G894 Chronic pain syndrome: Secondary | ICD-10-CM | POA: Diagnosis not present

## 2017-06-12 DIAGNOSIS — M25562 Pain in left knee: Secondary | ICD-10-CM | POA: Diagnosis not present

## 2017-06-12 DIAGNOSIS — S6992XA Unspecified injury of left wrist, hand and finger(s), initial encounter: Secondary | ICD-10-CM | POA: Diagnosis not present

## 2017-06-12 DIAGNOSIS — S8992XA Unspecified injury of left lower leg, initial encounter: Secondary | ICD-10-CM | POA: Diagnosis not present

## 2017-06-12 DIAGNOSIS — Z23 Encounter for immunization: Secondary | ICD-10-CM | POA: Diagnosis not present

## 2017-06-12 DIAGNOSIS — S80212A Abrasion, left knee, initial encounter: Secondary | ICD-10-CM | POA: Diagnosis not present

## 2017-06-12 DIAGNOSIS — K219 Gastro-esophageal reflux disease without esophagitis: Secondary | ICD-10-CM | POA: Diagnosis not present

## 2017-06-12 DIAGNOSIS — E119 Type 2 diabetes mellitus without complications: Secondary | ICD-10-CM | POA: Diagnosis not present

## 2017-06-15 DIAGNOSIS — G4733 Obstructive sleep apnea (adult) (pediatric): Secondary | ICD-10-CM | POA: Diagnosis not present

## 2017-06-15 DIAGNOSIS — M25579 Pain in unspecified ankle and joints of unspecified foot: Secondary | ICD-10-CM | POA: Diagnosis not present

## 2017-07-13 DIAGNOSIS — F39 Unspecified mood [affective] disorder: Secondary | ICD-10-CM | POA: Diagnosis not present

## 2017-10-07 DIAGNOSIS — I10 Essential (primary) hypertension: Secondary | ICD-10-CM | POA: Diagnosis not present

## 2017-10-07 DIAGNOSIS — G894 Chronic pain syndrome: Secondary | ICD-10-CM | POA: Diagnosis not present

## 2017-10-07 DIAGNOSIS — J069 Acute upper respiratory infection, unspecified: Secondary | ICD-10-CM | POA: Diagnosis not present

## 2017-10-07 DIAGNOSIS — E119 Type 2 diabetes mellitus without complications: Secondary | ICD-10-CM | POA: Diagnosis not present

## 2017-10-07 DIAGNOSIS — R29810 Facial weakness: Secondary | ICD-10-CM | POA: Diagnosis not present

## 2017-10-07 DIAGNOSIS — R05 Cough: Secondary | ICD-10-CM | POA: Diagnosis not present

## 2017-10-12 DIAGNOSIS — F419 Anxiety disorder, unspecified: Secondary | ICD-10-CM | POA: Diagnosis not present

## 2017-10-12 DIAGNOSIS — F339 Major depressive disorder, recurrent, unspecified: Secondary | ICD-10-CM | POA: Diagnosis not present

## 2017-10-15 DIAGNOSIS — I10 Essential (primary) hypertension: Secondary | ICD-10-CM | POA: Diagnosis not present

## 2017-10-15 DIAGNOSIS — E119 Type 2 diabetes mellitus without complications: Secondary | ICD-10-CM | POA: Diagnosis not present

## 2017-10-15 DIAGNOSIS — R29810 Facial weakness: Secondary | ICD-10-CM | POA: Diagnosis not present

## 2017-10-16 DIAGNOSIS — I6523 Occlusion and stenosis of bilateral carotid arteries: Secondary | ICD-10-CM | POA: Diagnosis not present

## 2017-10-22 DIAGNOSIS — M170 Bilateral primary osteoarthritis of knee: Secondary | ICD-10-CM | POA: Diagnosis not present

## 2017-10-29 DIAGNOSIS — E782 Mixed hyperlipidemia: Secondary | ICD-10-CM | POA: Diagnosis not present

## 2017-10-29 DIAGNOSIS — R29810 Facial weakness: Secondary | ICD-10-CM | POA: Diagnosis not present

## 2017-10-29 DIAGNOSIS — I10 Essential (primary) hypertension: Secondary | ICD-10-CM | POA: Diagnosis not present

## 2017-10-29 DIAGNOSIS — E119 Type 2 diabetes mellitus without complications: Secondary | ICD-10-CM | POA: Diagnosis not present

## 2017-10-29 DIAGNOSIS — Z23 Encounter for immunization: Secondary | ICD-10-CM | POA: Diagnosis not present

## 2018-01-05 DIAGNOSIS — F419 Anxiety disorder, unspecified: Secondary | ICD-10-CM | POA: Diagnosis not present

## 2018-01-05 DIAGNOSIS — F339 Major depressive disorder, recurrent, unspecified: Secondary | ICD-10-CM | POA: Diagnosis not present

## 2018-04-05 DIAGNOSIS — F339 Major depressive disorder, recurrent, unspecified: Secondary | ICD-10-CM | POA: Diagnosis not present

## 2018-04-05 DIAGNOSIS — F411 Generalized anxiety disorder: Secondary | ICD-10-CM | POA: Diagnosis not present

## 2018-04-27 DIAGNOSIS — I10 Essential (primary) hypertension: Secondary | ICD-10-CM | POA: Diagnosis not present

## 2018-04-29 DIAGNOSIS — I10 Essential (primary) hypertension: Secondary | ICD-10-CM | POA: Diagnosis not present

## 2018-04-29 DIAGNOSIS — G894 Chronic pain syndrome: Secondary | ICD-10-CM | POA: Diagnosis not present

## 2018-04-29 DIAGNOSIS — E782 Mixed hyperlipidemia: Secondary | ICD-10-CM | POA: Diagnosis not present

## 2018-04-29 DIAGNOSIS — E119 Type 2 diabetes mellitus without complications: Secondary | ICD-10-CM | POA: Diagnosis not present

## 2018-04-29 DIAGNOSIS — K219 Gastro-esophageal reflux disease without esophagitis: Secondary | ICD-10-CM | POA: Diagnosis not present

## 2018-04-29 DIAGNOSIS — Z79899 Other long term (current) drug therapy: Secondary | ICD-10-CM | POA: Diagnosis not present

## 2018-07-12 DIAGNOSIS — F411 Generalized anxiety disorder: Secondary | ICD-10-CM | POA: Diagnosis not present

## 2018-07-12 DIAGNOSIS — F339 Major depressive disorder, recurrent, unspecified: Secondary | ICD-10-CM | POA: Diagnosis not present

## 2018-08-04 DIAGNOSIS — I10 Essential (primary) hypertension: Secondary | ICD-10-CM | POA: Diagnosis not present

## 2018-08-04 DIAGNOSIS — E119 Type 2 diabetes mellitus without complications: Secondary | ICD-10-CM | POA: Diagnosis not present

## 2018-08-04 DIAGNOSIS — G894 Chronic pain syndrome: Secondary | ICD-10-CM | POA: Diagnosis not present

## 2018-08-04 DIAGNOSIS — Z23 Encounter for immunization: Secondary | ICD-10-CM | POA: Diagnosis not present

## 2018-10-14 DIAGNOSIS — F39 Unspecified mood [affective] disorder: Secondary | ICD-10-CM | POA: Diagnosis not present

## 2018-10-14 DIAGNOSIS — F609 Personality disorder, unspecified: Secondary | ICD-10-CM | POA: Diagnosis not present

## 2018-10-14 DIAGNOSIS — F411 Generalized anxiety disorder: Secondary | ICD-10-CM | POA: Diagnosis not present

## 2018-11-03 DIAGNOSIS — F411 Generalized anxiety disorder: Secondary | ICD-10-CM | POA: Diagnosis not present

## 2018-11-03 DIAGNOSIS — I10 Essential (primary) hypertension: Secondary | ICD-10-CM | POA: Diagnosis not present

## 2018-11-03 DIAGNOSIS — D649 Anemia, unspecified: Secondary | ICD-10-CM | POA: Diagnosis not present

## 2018-11-03 DIAGNOSIS — M25561 Pain in right knee: Secondary | ICD-10-CM | POA: Diagnosis not present

## 2018-11-03 DIAGNOSIS — G894 Chronic pain syndrome: Secondary | ICD-10-CM | POA: Diagnosis not present

## 2018-11-03 DIAGNOSIS — M1711 Unilateral primary osteoarthritis, right knee: Secondary | ICD-10-CM | POA: Diagnosis not present

## 2018-11-03 DIAGNOSIS — F39 Unspecified mood [affective] disorder: Secondary | ICD-10-CM | POA: Diagnosis not present

## 2018-11-03 DIAGNOSIS — Z79899 Other long term (current) drug therapy: Secondary | ICD-10-CM | POA: Diagnosis not present

## 2018-11-03 DIAGNOSIS — F609 Personality disorder, unspecified: Secondary | ICD-10-CM | POA: Diagnosis not present

## 2019-01-05 DIAGNOSIS — K760 Fatty (change of) liver, not elsewhere classified: Secondary | ICD-10-CM | POA: Diagnosis not present

## 2019-01-05 DIAGNOSIS — T148XXA Other injury of unspecified body region, initial encounter: Secondary | ICD-10-CM | POA: Diagnosis not present

## 2019-01-05 DIAGNOSIS — K7469 Other cirrhosis of liver: Secondary | ICD-10-CM | POA: Diagnosis not present

## 2019-01-05 DIAGNOSIS — K922 Gastrointestinal hemorrhage, unspecified: Secondary | ICD-10-CM | POA: Diagnosis not present

## 2019-01-05 DIAGNOSIS — R112 Nausea with vomiting, unspecified: Secondary | ICD-10-CM | POA: Diagnosis not present

## 2019-01-05 DIAGNOSIS — W0110XA Fall on same level from slipping, tripping and stumbling with subsequent striking against unspecified object, initial encounter: Secondary | ICD-10-CM | POA: Diagnosis not present

## 2019-01-05 DIAGNOSIS — Y999 Unspecified external cause status: Secondary | ICD-10-CM | POA: Diagnosis not present

## 2019-01-05 DIAGNOSIS — S300XXA Contusion of lower back and pelvis, initial encounter: Secondary | ICD-10-CM | POA: Diagnosis not present

## 2019-01-05 DIAGNOSIS — M545 Low back pain: Secondary | ICD-10-CM | POA: Diagnosis not present

## 2019-01-05 DIAGNOSIS — S3993XA Unspecified injury of pelvis, initial encounter: Secondary | ICD-10-CM | POA: Diagnosis not present

## 2019-01-05 DIAGNOSIS — E8770 Fluid overload, unspecified: Secondary | ICD-10-CM | POA: Diagnosis not present

## 2019-01-05 DIAGNOSIS — S335XXA Sprain of ligaments of lumbar spine, initial encounter: Secondary | ICD-10-CM | POA: Diagnosis not present

## 2019-01-05 DIAGNOSIS — D500 Iron deficiency anemia secondary to blood loss (chronic): Secondary | ICD-10-CM | POA: Diagnosis not present

## 2019-01-06 DIAGNOSIS — M545 Low back pain: Secondary | ICD-10-CM | POA: Diagnosis not present

## 2019-01-06 DIAGNOSIS — S3993XA Unspecified injury of pelvis, initial encounter: Secondary | ICD-10-CM | POA: Diagnosis not present

## 2019-01-07 DIAGNOSIS — F39 Unspecified mood [affective] disorder: Secondary | ICD-10-CM | POA: Diagnosis not present

## 2019-01-07 DIAGNOSIS — F609 Personality disorder, unspecified: Secondary | ICD-10-CM | POA: Diagnosis not present

## 2019-01-07 DIAGNOSIS — F1410 Cocaine abuse, uncomplicated: Secondary | ICD-10-CM | POA: Diagnosis not present

## 2019-01-07 DIAGNOSIS — F411 Generalized anxiety disorder: Secondary | ICD-10-CM | POA: Diagnosis not present

## 2019-01-11 DIAGNOSIS — M545 Low back pain: Secondary | ICD-10-CM | POA: Diagnosis not present

## 2019-01-11 DIAGNOSIS — G894 Chronic pain syndrome: Secondary | ICD-10-CM | POA: Diagnosis not present

## 2019-02-02 DIAGNOSIS — M549 Dorsalgia, unspecified: Secondary | ICD-10-CM | POA: Diagnosis not present

## 2019-02-02 DIAGNOSIS — D649 Anemia, unspecified: Secondary | ICD-10-CM | POA: Diagnosis not present

## 2019-02-02 DIAGNOSIS — I10 Essential (primary) hypertension: Secondary | ICD-10-CM | POA: Diagnosis not present

## 2019-02-02 DIAGNOSIS — G894 Chronic pain syndrome: Secondary | ICD-10-CM | POA: Diagnosis not present

## 2019-02-02 DIAGNOSIS — M47816 Spondylosis without myelopathy or radiculopathy, lumbar region: Secondary | ICD-10-CM | POA: Diagnosis not present

## 2019-02-02 DIAGNOSIS — Z6841 Body Mass Index (BMI) 40.0 and over, adult: Secondary | ICD-10-CM | POA: Diagnosis not present

## 2019-02-02 DIAGNOSIS — E782 Mixed hyperlipidemia: Secondary | ICD-10-CM | POA: Diagnosis not present

## 2019-02-02 DIAGNOSIS — E119 Type 2 diabetes mellitus without complications: Secondary | ICD-10-CM | POA: Diagnosis not present

## 2019-02-02 DIAGNOSIS — S335XXA Sprain of ligaments of lumbar spine, initial encounter: Secondary | ICD-10-CM | POA: Diagnosis not present

## 2019-02-16 DIAGNOSIS — M47816 Spondylosis without myelopathy or radiculopathy, lumbar region: Secondary | ICD-10-CM | POA: Diagnosis not present

## 2019-02-16 DIAGNOSIS — M545 Low back pain: Secondary | ICD-10-CM | POA: Diagnosis not present

## 2019-02-18 DIAGNOSIS — M8008XA Age-related osteoporosis with current pathological fracture, vertebra(e), initial encounter for fracture: Secondary | ICD-10-CM | POA: Diagnosis not present

## 2019-02-18 DIAGNOSIS — M549 Dorsalgia, unspecified: Secondary | ICD-10-CM | POA: Diagnosis not present

## 2019-02-18 DIAGNOSIS — M47816 Spondylosis without myelopathy or radiculopathy, lumbar region: Secondary | ICD-10-CM | POA: Diagnosis not present

## 2019-02-18 DIAGNOSIS — S335XXA Sprain of ligaments of lumbar spine, initial encounter: Secondary | ICD-10-CM | POA: Diagnosis not present

## 2019-02-22 DIAGNOSIS — D649 Anemia, unspecified: Secondary | ICD-10-CM | POA: Diagnosis not present

## 2019-02-22 DIAGNOSIS — S22000A Wedge compression fracture of unspecified thoracic vertebra, initial encounter for closed fracture: Secondary | ICD-10-CM | POA: Diagnosis not present

## 2019-02-22 DIAGNOSIS — E782 Mixed hyperlipidemia: Secondary | ICD-10-CM | POA: Diagnosis not present

## 2019-02-22 DIAGNOSIS — E119 Type 2 diabetes mellitus without complications: Secondary | ICD-10-CM | POA: Diagnosis not present

## 2019-02-22 DIAGNOSIS — G894 Chronic pain syndrome: Secondary | ICD-10-CM | POA: Diagnosis not present

## 2019-02-22 DIAGNOSIS — M533 Sacrococcygeal disorders, not elsewhere classified: Secondary | ICD-10-CM | POA: Diagnosis not present

## 2019-02-22 DIAGNOSIS — Z79899 Other long term (current) drug therapy: Secondary | ICD-10-CM | POA: Diagnosis not present

## 2019-02-22 DIAGNOSIS — M545 Low back pain: Secondary | ICD-10-CM | POA: Diagnosis not present

## 2019-03-17 DIAGNOSIS — M8008XA Age-related osteoporosis with current pathological fracture, vertebra(e), initial encounter for fracture: Secondary | ICD-10-CM | POA: Diagnosis not present

## 2019-03-17 DIAGNOSIS — M47816 Spondylosis without myelopathy or radiculopathy, lumbar region: Secondary | ICD-10-CM | POA: Diagnosis not present

## 2019-03-17 DIAGNOSIS — M546 Pain in thoracic spine: Secondary | ICD-10-CM | POA: Diagnosis not present

## 2019-03-30 DIAGNOSIS — M533 Sacrococcygeal disorders, not elsewhere classified: Secondary | ICD-10-CM | POA: Diagnosis not present

## 2019-03-30 DIAGNOSIS — G894 Chronic pain syndrome: Secondary | ICD-10-CM | POA: Diagnosis not present

## 2019-03-31 DIAGNOSIS — J90 Pleural effusion, not elsewhere classified: Secondary | ICD-10-CM | POA: Diagnosis not present

## 2019-03-31 DIAGNOSIS — S299XXA Unspecified injury of thorax, initial encounter: Secondary | ICD-10-CM | POA: Diagnosis not present

## 2019-03-31 DIAGNOSIS — Z9181 History of falling: Secondary | ICD-10-CM | POA: Diagnosis not present

## 2019-03-31 DIAGNOSIS — R0789 Other chest pain: Secondary | ICD-10-CM | POA: Diagnosis not present

## 2019-03-31 DIAGNOSIS — R0781 Pleurodynia: Secondary | ICD-10-CM | POA: Diagnosis not present

## 2019-03-31 DIAGNOSIS — W19XXXA Unspecified fall, initial encounter: Secondary | ICD-10-CM | POA: Diagnosis not present

## 2019-04-07 DIAGNOSIS — F609 Personality disorder, unspecified: Secondary | ICD-10-CM | POA: Diagnosis not present

## 2019-04-07 DIAGNOSIS — F330 Major depressive disorder, recurrent, mild: Secondary | ICD-10-CM | POA: Diagnosis not present

## 2019-04-07 DIAGNOSIS — F39 Unspecified mood [affective] disorder: Secondary | ICD-10-CM | POA: Diagnosis not present

## 2019-05-17 DIAGNOSIS — Z6841 Body Mass Index (BMI) 40.0 and over, adult: Secondary | ICD-10-CM | POA: Diagnosis not present

## 2019-05-17 DIAGNOSIS — M549 Dorsalgia, unspecified: Secondary | ICD-10-CM | POA: Diagnosis not present

## 2019-05-17 DIAGNOSIS — M8008XD Age-related osteoporosis with current pathological fracture, vertebra(e), subsequent encounter for fracture with routine healing: Secondary | ICD-10-CM | POA: Diagnosis not present

## 2019-05-30 DIAGNOSIS — M25562 Pain in left knee: Secondary | ICD-10-CM | POA: Diagnosis not present

## 2019-05-30 DIAGNOSIS — S22080S Wedge compression fracture of T11-T12 vertebra, sequela: Secondary | ICD-10-CM | POA: Diagnosis not present

## 2019-05-30 DIAGNOSIS — G8929 Other chronic pain: Secondary | ICD-10-CM | POA: Diagnosis not present

## 2019-05-30 DIAGNOSIS — M25561 Pain in right knee: Secondary | ICD-10-CM | POA: Diagnosis not present

## 2019-05-30 DIAGNOSIS — M533 Sacrococcygeal disorders, not elsewhere classified: Secondary | ICD-10-CM | POA: Diagnosis not present

## 2019-06-08 DIAGNOSIS — G8929 Other chronic pain: Secondary | ICD-10-CM | POA: Diagnosis not present

## 2019-06-08 DIAGNOSIS — M25562 Pain in left knee: Secondary | ICD-10-CM | POA: Diagnosis not present

## 2019-06-08 DIAGNOSIS — M1712 Unilateral primary osteoarthritis, left knee: Secondary | ICD-10-CM | POA: Diagnosis not present

## 2019-06-08 DIAGNOSIS — M25561 Pain in right knee: Secondary | ICD-10-CM | POA: Diagnosis not present

## 2019-06-09 DIAGNOSIS — M25561 Pain in right knee: Secondary | ICD-10-CM | POA: Diagnosis not present

## 2019-06-09 DIAGNOSIS — G8929 Other chronic pain: Secondary | ICD-10-CM | POA: Diagnosis not present

## 2019-06-09 DIAGNOSIS — M25562 Pain in left knee: Secondary | ICD-10-CM | POA: Diagnosis not present

## 2019-06-21 DIAGNOSIS — S22080S Wedge compression fracture of T11-T12 vertebra, sequela: Secondary | ICD-10-CM | POA: Diagnosis not present

## 2019-06-21 DIAGNOSIS — G894 Chronic pain syndrome: Secondary | ICD-10-CM | POA: Diagnosis not present

## 2019-06-21 DIAGNOSIS — M533 Sacrococcygeal disorders, not elsewhere classified: Secondary | ICD-10-CM | POA: Diagnosis not present

## 2019-07-04 DIAGNOSIS — M533 Sacrococcygeal disorders, not elsewhere classified: Secondary | ICD-10-CM | POA: Diagnosis not present

## 2019-07-06 DIAGNOSIS — F411 Generalized anxiety disorder: Secondary | ICD-10-CM | POA: Diagnosis not present

## 2019-07-07 DIAGNOSIS — M25462 Effusion, left knee: Secondary | ICD-10-CM | POA: Diagnosis not present

## 2019-07-07 DIAGNOSIS — M25562 Pain in left knee: Secondary | ICD-10-CM | POA: Diagnosis not present

## 2019-07-14 DIAGNOSIS — M1712 Unilateral primary osteoarthritis, left knee: Secondary | ICD-10-CM | POA: Diagnosis not present

## 2019-08-15 DIAGNOSIS — G4733 Obstructive sleep apnea (adult) (pediatric): Secondary | ICD-10-CM | POA: Diagnosis not present

## 2019-08-15 DIAGNOSIS — K219 Gastro-esophageal reflux disease without esophagitis: Secondary | ICD-10-CM | POA: Diagnosis not present

## 2019-08-15 DIAGNOSIS — E782 Mixed hyperlipidemia: Secondary | ICD-10-CM | POA: Diagnosis not present

## 2019-08-15 DIAGNOSIS — Z23 Encounter for immunization: Secondary | ICD-10-CM | POA: Diagnosis not present

## 2019-08-15 DIAGNOSIS — E119 Type 2 diabetes mellitus without complications: Secondary | ICD-10-CM | POA: Diagnosis not present

## 2019-08-15 DIAGNOSIS — I10 Essential (primary) hypertension: Secondary | ICD-10-CM | POA: Diagnosis not present

## 2019-08-15 DIAGNOSIS — Z1211 Encounter for screening for malignant neoplasm of colon: Secondary | ICD-10-CM | POA: Diagnosis not present

## 2019-08-15 DIAGNOSIS — Z6841 Body Mass Index (BMI) 40.0 and over, adult: Secondary | ICD-10-CM | POA: Diagnosis not present

## 2019-09-14 DIAGNOSIS — G894 Chronic pain syndrome: Secondary | ICD-10-CM | POA: Diagnosis not present

## 2019-09-14 DIAGNOSIS — M533 Sacrococcygeal disorders, not elsewhere classified: Secondary | ICD-10-CM | POA: Diagnosis not present

## 2019-09-14 DIAGNOSIS — M1712 Unilateral primary osteoarthritis, left knee: Secondary | ICD-10-CM | POA: Diagnosis not present

## 2019-09-20 DIAGNOSIS — G4733 Obstructive sleep apnea (adult) (pediatric): Secondary | ICD-10-CM | POA: Diagnosis not present

## 2019-10-21 DIAGNOSIS — M533 Sacrococcygeal disorders, not elsewhere classified: Secondary | ICD-10-CM | POA: Diagnosis not present

## 2019-10-21 DIAGNOSIS — G894 Chronic pain syndrome: Secondary | ICD-10-CM | POA: Diagnosis not present

## 2019-10-21 DIAGNOSIS — G4733 Obstructive sleep apnea (adult) (pediatric): Secondary | ICD-10-CM | POA: Diagnosis not present

## 2019-10-21 DIAGNOSIS — Z9989 Dependence on other enabling machines and devices: Secondary | ICD-10-CM | POA: Diagnosis not present

## 2019-10-21 DIAGNOSIS — F419 Anxiety disorder, unspecified: Secondary | ICD-10-CM | POA: Diagnosis not present

## 2019-11-04 DIAGNOSIS — F411 Generalized anxiety disorder: Secondary | ICD-10-CM | POA: Diagnosis not present

## 2019-11-04 DIAGNOSIS — F609 Personality disorder, unspecified: Secondary | ICD-10-CM | POA: Diagnosis not present

## 2019-11-04 DIAGNOSIS — F330 Major depressive disorder, recurrent, mild: Secondary | ICD-10-CM | POA: Diagnosis not present

## 2019-11-21 DIAGNOSIS — G4733 Obstructive sleep apnea (adult) (pediatric): Secondary | ICD-10-CM | POA: Diagnosis not present

## 2019-12-01 DIAGNOSIS — M533 Sacrococcygeal disorders, not elsewhere classified: Secondary | ICD-10-CM | POA: Diagnosis not present

## 2019-12-01 DIAGNOSIS — G8929 Other chronic pain: Secondary | ICD-10-CM | POA: Diagnosis not present

## 2019-12-01 DIAGNOSIS — M25561 Pain in right knee: Secondary | ICD-10-CM | POA: Diagnosis not present

## 2019-12-01 DIAGNOSIS — G894 Chronic pain syndrome: Secondary | ICD-10-CM | POA: Diagnosis not present

## 2019-12-01 DIAGNOSIS — M25562 Pain in left knee: Secondary | ICD-10-CM | POA: Diagnosis not present

## 2019-12-16 DIAGNOSIS — M25561 Pain in right knee: Secondary | ICD-10-CM | POA: Diagnosis not present

## 2019-12-16 DIAGNOSIS — M25562 Pain in left knee: Secondary | ICD-10-CM | POA: Diagnosis not present

## 2019-12-16 DIAGNOSIS — G8929 Other chronic pain: Secondary | ICD-10-CM | POA: Diagnosis not present

## 2020-02-29 DIAGNOSIS — S8992XA Unspecified injury of left lower leg, initial encounter: Secondary | ICD-10-CM | POA: Diagnosis not present

## 2020-02-29 DIAGNOSIS — M25562 Pain in left knee: Secondary | ICD-10-CM | POA: Diagnosis not present

## 2020-03-22 DIAGNOSIS — G4733 Obstructive sleep apnea (adult) (pediatric): Secondary | ICD-10-CM | POA: Diagnosis not present

## 2020-03-23 DIAGNOSIS — G4733 Obstructive sleep apnea (adult) (pediatric): Secondary | ICD-10-CM | POA: Diagnosis not present

## 2020-04-04 DIAGNOSIS — G4733 Obstructive sleep apnea (adult) (pediatric): Secondary | ICD-10-CM | POA: Diagnosis not present

## 2020-04-12 DIAGNOSIS — M25562 Pain in left knee: Secondary | ICD-10-CM | POA: Diagnosis not present

## 2020-04-12 DIAGNOSIS — M25561 Pain in right knee: Secondary | ICD-10-CM | POA: Diagnosis not present

## 2020-04-12 DIAGNOSIS — G894 Chronic pain syndrome: Secondary | ICD-10-CM | POA: Diagnosis not present

## 2020-04-12 DIAGNOSIS — M533 Sacrococcygeal disorders, not elsewhere classified: Secondary | ICD-10-CM | POA: Diagnosis not present

## 2020-04-12 DIAGNOSIS — G8929 Other chronic pain: Secondary | ICD-10-CM | POA: Diagnosis not present

## 2020-05-01 DIAGNOSIS — F39 Unspecified mood [affective] disorder: Secondary | ICD-10-CM | POA: Diagnosis not present

## 2020-05-01 DIAGNOSIS — F411 Generalized anxiety disorder: Secondary | ICD-10-CM | POA: Diagnosis not present

## 2020-05-01 DIAGNOSIS — F320 Major depressive disorder, single episode, mild: Secondary | ICD-10-CM | POA: Diagnosis not present

## 2020-05-01 DIAGNOSIS — Z79899 Other long term (current) drug therapy: Secondary | ICD-10-CM | POA: Diagnosis not present

## 2020-05-04 DIAGNOSIS — G4733 Obstructive sleep apnea (adult) (pediatric): Secondary | ICD-10-CM | POA: Diagnosis not present

## 2020-05-09 DIAGNOSIS — M1712 Unilateral primary osteoarthritis, left knee: Secondary | ICD-10-CM | POA: Diagnosis not present

## 2020-05-09 DIAGNOSIS — Z9989 Dependence on other enabling machines and devices: Secondary | ICD-10-CM | POA: Diagnosis not present

## 2020-05-09 DIAGNOSIS — G4733 Obstructive sleep apnea (adult) (pediatric): Secondary | ICD-10-CM | POA: Diagnosis not present

## 2020-05-09 DIAGNOSIS — G4719 Other hypersomnia: Secondary | ICD-10-CM | POA: Diagnosis not present

## 2020-05-16 DIAGNOSIS — M25561 Pain in right knee: Secondary | ICD-10-CM | POA: Diagnosis not present

## 2020-05-16 DIAGNOSIS — M25562 Pain in left knee: Secondary | ICD-10-CM | POA: Diagnosis not present

## 2020-05-16 DIAGNOSIS — G8929 Other chronic pain: Secondary | ICD-10-CM | POA: Diagnosis not present

## 2020-06-04 DIAGNOSIS — G4733 Obstructive sleep apnea (adult) (pediatric): Secondary | ICD-10-CM | POA: Diagnosis not present

## 2020-06-13 DIAGNOSIS — G4733 Obstructive sleep apnea (adult) (pediatric): Secondary | ICD-10-CM | POA: Diagnosis not present

## 2020-06-13 DIAGNOSIS — K430 Incisional hernia with obstruction, without gangrene: Secondary | ICD-10-CM | POA: Diagnosis not present

## 2020-06-13 DIAGNOSIS — E119 Type 2 diabetes mellitus without complications: Secondary | ICD-10-CM | POA: Diagnosis not present

## 2020-06-13 DIAGNOSIS — F1020 Alcohol dependence, uncomplicated: Secondary | ICD-10-CM | POA: Diagnosis not present

## 2020-06-13 DIAGNOSIS — I10 Essential (primary) hypertension: Secondary | ICD-10-CM | POA: Diagnosis not present

## 2020-06-13 DIAGNOSIS — Z1211 Encounter for screening for malignant neoplasm of colon: Secondary | ICD-10-CM | POA: Diagnosis not present

## 2020-06-13 DIAGNOSIS — Z7689 Persons encountering health services in other specified circumstances: Secondary | ICD-10-CM | POA: Diagnosis not present

## 2020-06-13 DIAGNOSIS — E782 Mixed hyperlipidemia: Secondary | ICD-10-CM | POA: Diagnosis not present

## 2020-06-13 DIAGNOSIS — F411 Generalized anxiety disorder: Secondary | ICD-10-CM | POA: Diagnosis not present

## 2020-06-13 DIAGNOSIS — I340 Nonrheumatic mitral (valve) insufficiency: Secondary | ICD-10-CM | POA: Diagnosis not present

## 2020-06-13 DIAGNOSIS — Z23 Encounter for immunization: Secondary | ICD-10-CM | POA: Diagnosis not present

## 2020-06-13 DIAGNOSIS — F330 Major depressive disorder, recurrent, mild: Secondary | ICD-10-CM | POA: Diagnosis not present

## 2020-07-06 DIAGNOSIS — G894 Chronic pain syndrome: Secondary | ICD-10-CM | POA: Diagnosis not present

## 2020-07-06 DIAGNOSIS — M533 Sacrococcygeal disorders, not elsewhere classified: Secondary | ICD-10-CM | POA: Diagnosis not present

## 2020-07-06 DIAGNOSIS — M25562 Pain in left knee: Secondary | ICD-10-CM | POA: Diagnosis not present

## 2020-07-06 DIAGNOSIS — M170 Bilateral primary osteoarthritis of knee: Secondary | ICD-10-CM | POA: Diagnosis not present

## 2020-07-06 DIAGNOSIS — M25561 Pain in right knee: Secondary | ICD-10-CM | POA: Diagnosis not present

## 2020-07-06 DIAGNOSIS — G8929 Other chronic pain: Secondary | ICD-10-CM | POA: Diagnosis not present

## 2020-08-01 DIAGNOSIS — F39 Unspecified mood [affective] disorder: Secondary | ICD-10-CM | POA: Diagnosis not present

## 2020-08-01 DIAGNOSIS — F411 Generalized anxiety disorder: Secondary | ICD-10-CM | POA: Diagnosis not present

## 2020-08-01 DIAGNOSIS — F330 Major depressive disorder, recurrent, mild: Secondary | ICD-10-CM | POA: Diagnosis not present

## 2020-08-23 DIAGNOSIS — R0902 Hypoxemia: Secondary | ICD-10-CM | POA: Diagnosis not present

## 2020-08-23 DIAGNOSIS — R778 Other specified abnormalities of plasma proteins: Secondary | ICD-10-CM | POA: Diagnosis not present

## 2020-08-23 DIAGNOSIS — R531 Weakness: Secondary | ICD-10-CM | POA: Diagnosis not present

## 2020-08-23 DIAGNOSIS — I959 Hypotension, unspecified: Secondary | ICD-10-CM | POA: Diagnosis not present

## 2020-08-23 DIAGNOSIS — R5383 Other fatigue: Secondary | ICD-10-CM | POA: Diagnosis not present

## 2020-08-23 DIAGNOSIS — R42 Dizziness and giddiness: Secondary | ICD-10-CM | POA: Diagnosis not present

## 2020-08-23 DIAGNOSIS — Z5320 Procedure and treatment not carried out because of patient's decision for unspecified reasons: Secondary | ICD-10-CM | POA: Diagnosis not present

## 2020-08-23 DIAGNOSIS — R5381 Other malaise: Secondary | ICD-10-CM | POA: Diagnosis not present

## 2020-08-23 DIAGNOSIS — R000 Tachycardia, unspecified: Secondary | ICD-10-CM | POA: Diagnosis not present

## 2020-08-24 DIAGNOSIS — R42 Dizziness and giddiness: Secondary | ICD-10-CM | POA: Diagnosis not present

## 2020-08-24 DIAGNOSIS — R5383 Other fatigue: Secondary | ICD-10-CM | POA: Diagnosis not present

## 2020-08-27 DIAGNOSIS — D649 Anemia, unspecified: Secondary | ICD-10-CM | POA: Diagnosis not present

## 2020-08-27 DIAGNOSIS — G4452 New daily persistent headache (NDPH): Secondary | ICD-10-CM | POA: Diagnosis not present

## 2020-08-27 DIAGNOSIS — H6121 Impacted cerumen, right ear: Secondary | ICD-10-CM | POA: Diagnosis not present

## 2020-08-27 DIAGNOSIS — Z09 Encounter for follow-up examination after completed treatment for conditions other than malignant neoplasm: Secondary | ICD-10-CM | POA: Diagnosis not present

## 2020-08-27 DIAGNOSIS — I10 Essential (primary) hypertension: Secondary | ICD-10-CM | POA: Diagnosis not present

## 2020-08-27 DIAGNOSIS — H6122 Impacted cerumen, left ear: Secondary | ICD-10-CM | POA: Diagnosis not present

## 2020-08-27 DIAGNOSIS — N179 Acute kidney failure, unspecified: Secondary | ICD-10-CM | POA: Diagnosis not present

## 2020-08-27 DIAGNOSIS — H6123 Impacted cerumen, bilateral: Secondary | ICD-10-CM | POA: Diagnosis not present

## 2020-08-27 DIAGNOSIS — R778 Other specified abnormalities of plasma proteins: Secondary | ICD-10-CM | POA: Diagnosis not present

## 2020-08-27 DIAGNOSIS — I340 Nonrheumatic mitral (valve) insufficiency: Secondary | ICD-10-CM | POA: Diagnosis not present

## 2020-09-17 DIAGNOSIS — H6122 Impacted cerumen, left ear: Secondary | ICD-10-CM | POA: Diagnosis not present

## 2020-09-17 DIAGNOSIS — H6123 Impacted cerumen, bilateral: Secondary | ICD-10-CM | POA: Diagnosis not present

## 2020-09-17 DIAGNOSIS — H6121 Impacted cerumen, right ear: Secondary | ICD-10-CM | POA: Diagnosis not present

## 2020-09-18 DIAGNOSIS — M25561 Pain in right knee: Secondary | ICD-10-CM | POA: Diagnosis not present

## 2020-09-18 DIAGNOSIS — Z9989 Dependence on other enabling machines and devices: Secondary | ICD-10-CM | POA: Diagnosis not present

## 2020-09-18 DIAGNOSIS — M25562 Pain in left knee: Secondary | ICD-10-CM | POA: Diagnosis not present

## 2020-09-18 DIAGNOSIS — G4733 Obstructive sleep apnea (adult) (pediatric): Secondary | ICD-10-CM | POA: Diagnosis not present

## 2020-10-02 DIAGNOSIS — I959 Hypotension, unspecified: Secondary | ICD-10-CM | POA: Diagnosis not present

## 2020-10-02 DIAGNOSIS — Z79899 Other long term (current) drug therapy: Secondary | ICD-10-CM | POA: Diagnosis not present

## 2020-10-02 DIAGNOSIS — E119 Type 2 diabetes mellitus without complications: Secondary | ICD-10-CM | POA: Diagnosis not present

## 2020-10-02 DIAGNOSIS — R410 Disorientation, unspecified: Secondary | ICD-10-CM | POA: Diagnosis not present

## 2020-10-02 DIAGNOSIS — I517 Cardiomegaly: Secondary | ICD-10-CM | POA: Diagnosis not present

## 2020-10-02 DIAGNOSIS — I340 Nonrheumatic mitral (valve) insufficiency: Secondary | ICD-10-CM | POA: Diagnosis not present

## 2021-11-23 ENCOUNTER — Emergency Department (HOSPITAL_BASED_OUTPATIENT_CLINIC_OR_DEPARTMENT_OTHER): Payer: Medicare (Managed Care)

## 2021-11-23 ENCOUNTER — Encounter (HOSPITAL_BASED_OUTPATIENT_CLINIC_OR_DEPARTMENT_OTHER): Payer: Self-pay | Admitting: Emergency Medicine

## 2021-11-23 ENCOUNTER — Emergency Department (HOSPITAL_BASED_OUTPATIENT_CLINIC_OR_DEPARTMENT_OTHER)
Admission: EM | Admit: 2021-11-23 | Discharge: 2021-11-24 | Payer: Medicare (Managed Care) | Attending: Emergency Medicine | Admitting: Emergency Medicine

## 2021-11-23 DIAGNOSIS — Z20822 Contact with and (suspected) exposure to covid-19: Secondary | ICD-10-CM | POA: Insufficient documentation

## 2021-11-23 DIAGNOSIS — S0990XA Unspecified injury of head, initial encounter: Secondary | ICD-10-CM

## 2021-11-23 DIAGNOSIS — W19XXXA Unspecified fall, initial encounter: Secondary | ICD-10-CM | POA: Diagnosis not present

## 2021-11-23 DIAGNOSIS — S0101XA Laceration without foreign body of scalp, initial encounter: Secondary | ICD-10-CM | POA: Diagnosis present

## 2021-11-23 DIAGNOSIS — R55 Syncope and collapse: Secondary | ICD-10-CM | POA: Diagnosis not present

## 2021-11-23 LAB — BASIC METABOLIC PANEL
Anion gap: 10 (ref 5–15)
BUN: 16 mg/dL (ref 8–23)
CO2: 24 mmol/L (ref 22–32)
Calcium: 8.2 mg/dL — ABNORMAL LOW (ref 8.9–10.3)
Chloride: 98 mmol/L (ref 98–111)
Creatinine, Ser: 1.06 mg/dL (ref 0.61–1.24)
GFR, Estimated: >60 mL/min (ref >60)
Glucose, Bld: 114 mg/dL — ABNORMAL HIGH (ref 70–99)
Potassium: 3.8 mmol/L (ref 3.5–5.1)
Sodium: 132 mmol/L — ABNORMAL LOW (ref 135–145)

## 2021-11-23 LAB — CBC
HCT: 45.8 % (ref 39.0–52.0)
Hemoglobin: 15.2 g/dL (ref 13.0–17.0)
MCH: 31.3 pg (ref 26.0–34.0)
MCHC: 33.2 g/dL (ref 30.0–36.0)
MCV: 94.2 fL (ref 80.0–100.0)
Platelets: 186 10*3/uL (ref 150–400)
RBC: 4.86 MIL/uL (ref 4.22–5.81)
RDW: 13.7 % (ref 11.5–15.5)
WBC: 7.1 10*3/uL (ref 4.0–10.5)
nRBC: 0 % (ref 0.0–0.2)

## 2021-11-23 LAB — URINALYSIS, ROUTINE W REFLEX MICROSCOPIC
Bilirubin Urine: NEGATIVE
Glucose, UA: NEGATIVE mg/dL
Hgb urine dipstick: NEGATIVE
Ketones, ur: NEGATIVE mg/dL
Leukocytes,Ua: NEGATIVE
Nitrite: NEGATIVE
Protein, ur: NEGATIVE mg/dL
Specific Gravity, Urine: <=1.005 (ref 1.005–1.030)
pH: 6 (ref 5.0–8.0)

## 2021-11-23 LAB — TROPONIN I (HIGH SENSITIVITY): Troponin I (High Sensitivity): 10 ng/L (ref <18)

## 2021-11-23 LAB — CBG MONITORING, ED: Glucose-Capillary: 122 mg/dL — ABNORMAL HIGH (ref 70–99)

## 2021-11-23 MED ORDER — LIDOCAINE-EPINEPHRINE-TETRACAINE (LET) TOPICAL GEL
3.0000 mL | Freq: Once | TOPICAL | Status: AC
  Administered 2021-11-23: 3 mL via TOPICAL
  Filled 2021-11-23: qty 3

## 2021-11-23 MED ORDER — SODIUM CHLORIDE 0.9 % IV BOLUS
500.0000 mL | Freq: Once | INTRAVENOUS | Status: AC
  Administered 2021-11-24: 500 mL via INTRAVENOUS

## 2021-11-23 NOTE — ED Triage Notes (Signed)
Pt states has been passing out over the last couple of months. Did so tonight, fell and hit head left side. 4-6 cm lac noted, bleeding controlled.

## 2021-11-23 NOTE — ED Notes (Signed)
Forgot to give pt a urine cup, pt used the restroom will attempt to ask again later for urine sample.

## 2021-11-24 ENCOUNTER — Encounter (HOSPITAL_BASED_OUTPATIENT_CLINIC_OR_DEPARTMENT_OTHER): Payer: Self-pay | Admitting: Emergency Medicine

## 2021-11-24 LAB — RESP PANEL BY RT-PCR (FLU A&B, COVID) ARPGX2
Influenza A by PCR: NEGATIVE
Influenza B by PCR: NEGATIVE
SARS Coronavirus 2 by RT PCR: NEGATIVE

## 2021-11-24 LAB — TROPONIN I (HIGH SENSITIVITY)
Troponin I (High Sensitivity): 10 ng/L (ref <18)
Troponin I (High Sensitivity): 9 ng/L (ref <18)

## 2021-11-24 NOTE — ED Provider Notes (Signed)
[place] EMERGENCY DEPARTMENT Provider Note   CSN: [id number] Arrival date & time: 11/23/21  2155     History  Chief Complaint  Patient presents with   Laceration   Fall   Loss of Consciousness    Wayne Walsh is a 66 y.o. male.  The history is provided by the patient and the spouse.  Laceration Location:  Head/neck Head/neck laceration location:  Scalp Length:  4 cm Depth:  Through dermis Quality: straight   Bleeding: controlled   Time since incident:  2 hours Laceration mechanism:  Fall (Syncope) Pain details:    Quality:  Aching   Severity:  Mild   Timing:  Constant   Progression:  Unchanged Foreign body present:  No foreign bodies Relieved by:  Nothing Worsened by:  Nothing Ineffective treatments:  None tried Tetanus status:  Up to date Associated symptoms: no fever and no rash   Fall This is a recurrent problem. The current episode started 1 to 2 hours ago. The problem occurs constantly. The problem has not changed since onset.Pertinent negatives include no chest pain, no abdominal pain and no shortness of breath. Nothing aggravates the symptoms. Nothing relieves the symptoms. He has tried nothing for the symptoms. The treatment provided no relief.  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  No f/c/r No n/v/d.  No CP.      Home Medications Prior to Admission medications   Medication Sig Start Date End Date Taking? Authorizing  Provider  ciprofloxacin (CIPRO) 500 MG tablet Take 1 tablet (500 mg total) by mouth 2 (two) times daily. 08/08/14   Blanchie Dessert, MD  DULoxetine HCl (CYMBALTA PO) Take by mouth.    [provider]  FLUoxetine (PROZAC) 40 MG capsule Take 40 mg by mouth daily.      [provider]  HYDROcodone-acetaminophen (NORCO/VICODIN) 5-325 MG per tablet Take 1-2 tablets by mouth every 6 (six) hours as needed for moderate pain or severe pain. 08/08/14   Blanchie Dessert, MD  ibuprofen (ADVIL,MOTRIN) 200 MG tablet Take 600 mg by mouth every 6 (six) hours as needed. For pain     [provider]  LISINOPRIL PO Take 1 tablet by mouth daily.      [provider]  metroNIDAZOLE (FLAGYL) 500 MG tablet Take 1 tablet (500 mg total) by mouth 2 (two) times daily. 08/08/14   Blanchie Dessert, MD  oxymetazoline (AFRIN) 0.05 % nasal spray Place 2 sprays into the nose 2 (two) times daily as needed.      [provider]      Allergies    Morphine and related and Sulfa antibiotics    Review of Systems   Review of Systems  Constitutional:  Negative for fever.  HENT:  Negative for facial swelling.   Eyes:  Negative for redness.  Respiratory:  Negative for shortness of breath.   Cardiovascular:  Positive for syncope. Negative for chest pain.  Gastrointestinal:  Negative for abdominal pain and vomiting.  Genitourinary:  Negative for difficulty urinating.  Musculoskeletal:  Negative for neck pain.  Skin:  Positive for wound. Negative for rash.  Neurological:  Positive for syncope. Negative for facial asymmetry.  Psychiatric/Behavioral:  Negative for agitation.   All other systems reviewed and are negative.  Physical Exam Updated Vital Signs BP 132/70    Pulse 75    Temp 97.9 F (36.6 C) (Oral)    Resp 15    Ht 5\' 10"  (1.778 m)    Wt (!) 142.9 kg    SpO2 94%    BMI 45.20 kg/m  Physical Exam Vitals and nursing note reviewed.  Constitutional:      General: He is not in  acute distress.    Appearance: Normal appearance. He is not diaphoretic.  HENT:     Head: Normocephalic.      Right Ear: Tympanic membrane normal.     Left Ear: Tympanic membrane normal.     Nose: Nose normal.     Mouth/Throat:     Mouth: Mucous membranes are moist.  Eyes:     Extraocular Movements: Extraocular movements intact.     Conjunctiva/sclera: Conjunctivae normal.     Pupils: Pupils are equal, round, and reactive to light.  Cardiovascular:     Rate and Rhythm: Normal rate and regular rhythm.     Pulses: Normal pulses.     Heart sounds: Normal heart sounds.  Pulmonary:     Effort: Pulmonary effort is normal.     Breath sounds: Normal breath sounds.  Abdominal:     General: Bowel sounds are normal.     Palpations: Abdomen is soft.     Tenderness: There is no abdominal tenderness. There is no guarding.  Musculoskeletal:        General: Normal range of motion.     Cervical back: Normal range of motion and neck supple. No tenderness.     Right lower leg: No edema.     Left lower leg: No edema.  Skin:    General: Skin is warm and dry.  Neurological:     General: No focal deficit present.     Mental Status: He is alert and oriented to person, place, and time.     Cranial Nerves: No cranial nerve deficit.     Deep Tendon Reflexes: Reflexes normal.  Psychiatric:        Mood and Affect: Mood normal.        Behavior: Behavior normal.    ED Results / Procedures / Treatments   Labs (all labs ordered are listed, but only abnormal results are displayed) Results for orders placed or performed during the hospital encounter of 11/23/21  Resp Panel by RT-PCR (Flu A&B, Covid) Nasopharyngeal Swab   Specimen: Nasopharyngeal Swab; Nasopharyngeal(NP) swabs in vial transport medium  Result Value Ref Range   SARS Coronavirus 2 by RT PCR NEGATIVE NEGATIVE   Influenza A by PCR NEGATIVE NEGATIVE   Influenza B by PCR NEGATIVE NEGATIVE  Basic metabolic panel  Result Value Ref Range    Sodium 132 (L) 135 - 145 mmol/L   Potassium 3.8 3.5 - 5.1 mmol/L   Chloride 98 98 - 111 mmol/L   CO2 24 22 - 32 mmol/L   Glucose, Bld 114 (H) 70 - 99 mg/dL   BUN 16 8 - 23 mg/dL   Creatinine, Ser 1.06 0.61 - 1.24 mg/dL   Calcium 8.2 (L) 8.9 - 10.3 mg/dL   GFR, Estimated >60 >60 mL/min   Anion gap  10 5 - 15  CBC  Result Value Ref Range   WBC 7.1 4.0 - 10.5 K/uL   RBC 4.86 4.22 - 5.81 MIL/uL   Hemoglobin 15.2 13.0 - 17.0 g/dL   HCT 45.8 39.0 - 52.0 %   MCV 94.2 80.0 - 100.0 fL   MCH 31.3 26.0 - 34.0 pg   MCHC 33.2 30.0 - 36.0 g/dL   RDW 13.7 11.5 - 15.5 %   Platelets 186 150 - 400 K/uL   nRBC 0.0 0.0 - 0.2 %  Urinalysis, Routine w reflex microscopic Urine, Clean Catch  Result Value Ref Range   Color, Urine YELLOW YELLOW   APPearance CLEAR CLEAR   Specific Gravity, Urine <=1.005 1.005 - 1.030   pH 6.0 5.0 - 8.0   Glucose, UA NEGATIVE NEGATIVE mg/dL   Hgb urine dipstick NEGATIVE NEGATIVE   Bilirubin Urine NEGATIVE NEGATIVE   Ketones, ur NEGATIVE NEGATIVE mg/dL   Protein, ur NEGATIVE NEGATIVE mg/dL   Nitrite NEGATIVE NEGATIVE   Leukocytes,Ua NEGATIVE NEGATIVE  CBG monitoring, ED  Result Value Ref Range   Glucose-Capillary 122 (H) 70 - 99 mg/dL  Troponin I (High Sensitivity)  Result Value Ref Range   Troponin I (High Sensitivity) 10 <18 ng/L  Troponin I (High Sensitivity)  Result Value Ref Range   Troponin I (High Sensitivity) 10 <18 ng/L   CT Head Wo Contrast  Result Date: 11/23/2021 CLINICAL DATA:  Recent syncopal episodes with headaches, initial encounter EXAM: CT HEAD WITHOUT CONTRAST CT CERVICAL SPINE WITHOUT CONTRAST TECHNIQUE: Multidetector CT imaging of the head and cervical spine was performed following the standard protocol without intravenous contrast. Multiplanar CT image reconstructions of the cervical spine were also generated. RADIATION DOSE REDUCTION: This exam was performed according to the departmental dose-optimization program which includes automated  exposure control, adjustment of the mA and/or kV according to patient size and/or use of iterative reconstruction technique. COMPARISON:  02/08/2021 FINDINGS: CT HEAD FINDINGS Brain: No evidence of acute infarction, hemorrhage, hydrocephalus, extra-axial collection or mass lesion/mass effect. Mild atrophic changes are noted. Mild chronic white matter ischemic changes are seen as well. Vascular: No hyperdense vessel or unexpected calcification. Skull: Normal. Negative for fracture or focal lesion. Sinuses/Orbits: No acute finding. Other: Mild scalp laceration is noted in the left posterior parietal region near the vertex. CT CERVICAL SPINE FINDINGS Alignment: Mild straightening of the normal cervical lordosis is noted. Skull base and vertebrae: 7 cervical segments are well visualized. Vertebral body height is well maintained. Disc space narrowing is noted worst at C5-6 and C6-7. Osteophytic changes are seen. Facet hypertrophic changes are noted as well. No acute fracture or acute facet abnormality is noted. Soft tissues and spinal canal: Surrounding soft tissue structures are within normal limits. Upper chest: Visualized lung apices are unremarkable. Other: None IMPRESSION: CT of the head: No acute intracranial abnormality noted. CT of the cervical spine: Degenerative change without acute abnormality. Electronically Signed   By: Inez Catalina M.D.   On: 11/23/2021 23:21   CT Cervical Spine Wo Contrast  Result Date: 11/23/2021 CLINICAL DATA:  Recent syncopal episodes with headaches, initial encounter EXAM: CT HEAD WITHOUT CONTRAST CT CERVICAL SPINE WITHOUT CONTRAST TECHNIQUE: Multidetector CT imaging of the head and cervical spine was performed following the standard protocol without intravenous contrast. Multiplanar CT image reconstructions of the cervical spine were also generated. RADIATION DOSE REDUCTION: This exam was performed according to the departmental dose-optimization program which includes automated  exposure control, adjustment of the mA and/or kV according  to patient size and/or use of iterative reconstruction technique. COMPARISON:  02/08/2021 FINDINGS: CT HEAD FINDINGS Brain: No evidence of acute infarction, hemorrhage, hydrocephalus, extra-axial collection or mass lesion/mass effect. Mild atrophic changes are noted. Mild chronic white matter ischemic changes are seen as well. Vascular: No hyperdense vessel or unexpected calcification. Skull: Normal. Negative for fracture or focal lesion. Sinuses/Orbits: No acute finding. Other: Mild scalp laceration is noted in the left posterior parietal region near the vertex. CT CERVICAL SPINE FINDINGS Alignment: Mild straightening of the normal cervical lordosis is noted. Skull base and vertebrae: 7 cervical segments are well visualized. Vertebral body height is well maintained. Disc space narrowing is noted worst at C5-6 and C6-7. Osteophytic changes are seen. Facet hypertrophic changes are noted as well. No acute fracture or acute facet abnormality is noted. Soft tissues and spinal canal: Surrounding soft tissue structures are within normal limits. Upper chest: Visualized lung apices are unremarkable. Other: None IMPRESSION: CT of the head: No acute intracranial abnormality noted. CT of the cervical spine: Degenerative change without acute abnormality. Electronically Signed   By: Inez Catalina M.D.   On: 11/23/2021 23:21   DG Chest Portable 1 View  Result Date: 11/23/2021 CLINICAL DATA:  Recent syncopal episode EXAM: PORTABLE CHEST 1 VIEW COMPARISON:  02/08/2021 FINDINGS: Cardiac shadow is enlarged but stable. Defibrillator is again noted. Lungs are well aerated bilaterally. Chronic blunting of the left costophrenic angle is noted. No focal infiltrate or effusion is seen. No bony abnormality is noted. IMPRESSION: No active disease. Electronically Signed   By: Inez Catalina M.D.   On: 11/23/2021 23:54    None  Radiology CT Head Wo Contrast  Result Date:  11/23/2021 CLINICAL DATA:  Recent syncopal episodes with headaches, initial encounter EXAM: CT HEAD WITHOUT CONTRAST CT CERVICAL SPINE WITHOUT CONTRAST TECHNIQUE: Multidetector CT imaging of the head and cervical spine was performed following the standard protocol without intravenous contrast. Multiplanar CT image reconstructions of the cervical spine were also generated. RADIATION DOSE REDUCTION: This exam was performed according to the departmental dose-optimization program which includes automated exposure control, adjustment of the mA and/or kV according to patient size and/or use of iterative reconstruction technique. COMPARISON:  02/08/2021 FINDINGS: CT HEAD FINDINGS Brain: No evidence of acute infarction, hemorrhage, hydrocephalus, extra-axial collection or mass lesion/mass effect. Mild atrophic changes are noted. Mild chronic white matter ischemic changes are seen as well. Vascular: No hyperdense vessel or unexpected calcification. Skull: Normal. Negative for fracture or focal lesion. Sinuses/Orbits: No acute finding. Other: Mild scalp laceration is noted in the left posterior parietal region near the vertex. CT CERVICAL SPINE FINDINGS Alignment: Mild straightening of the normal cervical lordosis is noted. Skull base and vertebrae: 7 cervical segments are well visualized. Vertebral body height is well maintained. Disc space narrowing is noted worst at C5-6 and C6-7. Osteophytic changes are seen. Facet hypertrophic changes are noted as well. No acute fracture or acute facet abnormality is noted. Soft tissues and spinal canal: Surrounding soft tissue structures are within normal limits. Upper chest: Visualized lung apices are unremarkable. Other: None IMPRESSION: CT of the head: No acute intracranial abnormality noted. CT of the cervical spine: Degenerative change without acute abnormality. Electronically Signed   By: Inez Catalina M.D.   On: 11/23/2021 23:21   CT Cervical Spine Wo Contrast  Result Date:  11/23/2021 CLINICAL DATA:  Recent syncopal episodes with headaches, initial encounter EXAM: CT HEAD WITHOUT CONTRAST CT CERVICAL SPINE WITHOUT CONTRAST TECHNIQUE: Multidetector CT imaging of the head and  cervical spine was performed following the standard protocol without intravenous contrast. Multiplanar CT image reconstructions of the cervical spine were also generated. RADIATION DOSE REDUCTION: This exam was performed according to the departmental dose-optimization program which includes automated exposure control, adjustment of the mA and/or kV according to patient size and/or use of iterative reconstruction technique. COMPARISON:  02/08/2021 FINDINGS: CT HEAD FINDINGS Brain: No evidence of acute infarction, hemorrhage, hydrocephalus, extra-axial collection or mass lesion/mass effect. Mild atrophic changes are noted. Mild chronic white matter ischemic changes are seen as well. Vascular: No hyperdense vessel or unexpected calcification. Skull: Normal. Negative for fracture or focal lesion. Sinuses/Orbits: No acute finding. Other: Mild scalp laceration is noted in the left posterior parietal region near the vertex. CT CERVICAL SPINE FINDINGS Alignment: Mild straightening of the normal cervical lordosis is noted. Skull base and vertebrae: 7 cervical segments are well visualized. Vertebral body height is well maintained. Disc space narrowing is noted worst at C5-6 and C6-7. Osteophytic changes are seen. Facet hypertrophic changes are noted as well. No acute fracture or acute facet abnormality is noted. Soft tissues and spinal canal: Surrounding soft tissue structures are within normal limits. Upper chest: Visualized lung apices are unremarkable. Other: None IMPRESSION: CT of the head: No acute intracranial abnormality noted. CT of the cervical spine: Degenerative change without acute abnormality. Electronically Signed   By: Inez Catalina M.D.   On: 11/23/2021 23:21   DG Chest Portable 1 View  Result Date:  11/23/2021 CLINICAL DATA:  Recent syncopal episode EXAM: PORTABLE CHEST 1 VIEW COMPARISON:  02/08/2021 FINDINGS: Cardiac shadow is enlarged but stable. Defibrillator is again noted. Lungs are well aerated bilaterally. Chronic blunting of the left costophrenic angle is noted. No focal infiltrate or effusion is seen. No bony abnormality is noted. IMPRESSION: No active disease. Electronically Signed   By: Inez Catalina M.D.   On: 11/23/2021 23:54    Procedures .Marland KitchenLaceration Repair  Date/Time: 11/24/2021 1:55 AM Performed by: Veatrice Kells, MD Authorized by: Veatrice Kells, MD   Consent:    Consent obtained:  Verbal   Consent given by:  Patient   Risks, benefits, and alternatives were discussed: yes     Risks discussed:  Infection, pain, need for additional repair, nerve damage, poor wound healing, poor cosmetic result, tendon damage and vascular damage   Alternatives discussed:  No treatment Anesthesia:    Anesthesia method:  Topical application   Topical anesthetic:  LET Laceration details:    Location:  Scalp   Length (cm):  4   Depth (mm):  1 Pre-procedure details:    Preparation:  Patient was prepped and draped in usual sterile fashion Exploration:    Limited defect created (wound extended): no     Hemostasis achieved with:  Direct pressure   Imaging outcome: foreign body not noted     Wound exploration: wound explored through full range of motion     Wound extent: no areolar tissue violation noted     Contaminated: no   Treatment:    Area cleansed with:  Povidone-iodine, chlorhexidine and saline   Amount of cleaning:  Extensive   Irrigation solution:  Sterile saline   Irrigation method:  Syringe   Debridement:  None Skin repair:    Repair method:  Staples   Number of staples:  9 Approximation:    Approximation:  Close Repair type:    Repair type:  Complex Post-procedure details:    Dressing:  Open (no dressing)    Medications Ordered in ED  Medications   lidocaine-EPINEPHrine-tetracaine (LET) topical gel (3 mLs Topical Given 11/23/21 2352)  sodium chloride 0.9 % bolus 500 mL (0 mLs Intravenous Stopped 11/24/21 0115)    EKG Interpretation  Date/Time:  Saturday November 23 2021 22:19:00 EST Ventricular Rate:  77 PR Interval:  134 QRS Duration: 88 QT Interval:  424 QTC Calculation: 479 R Axis:   32 Text Interpretation: Sinus rhythm with Premature atrial complexes Minimal voltage criteria for LVH, may be normal variant ( R in aVL ) Nonspecific ST abnormality Confirmed by Randal Buba, Jewelle Whitner ([id number]) on 11/24/2021 1:53:37 AM        ED Course/ Medical Decision Making/ A&P                           Medical Decision Making Recurrent syncope with HOCM ICD and new medication and low BPs at home   Amount and/or Complexity of Data Reviewed Independent Historian: spouse    Details: see above External Data Reviewed: notes.    Details: Notes from OSH Labs: ordered.    Details: Reviewed by me: normal troponins, normal potassium and creatinine and normal CBC no elevation of white count, urinalysis is normal Radiology: ordered.    Details: CXR reviewed by me without CHF CTS reviewed by me without bleed to brain without fractures of the spine ECG/medicine tests: ordered and independent interpretation performed. Decision-making details documented in ED Course.    Details: see course Discussion of management or test interpretation with external provider(s): Case d/w Dr. Lavetta Nielsen at [facility] who will accept patient for admission   Risk Prescription drug management. Risk Details: ICD interrogated and is reportedly normal.  No ectopy on the monitor.  Patient is orthostatic likely secondary to new medications.  Patient will need admission given complex history and will need echo and cardiology consult.     The patient appears reasonably stabilized for admission considering the current resources, flow, and capabilities available in the ED at this time, and I doubt  any other [facility] requiring further screening and/or treatment in the ED prior to admission.  Final Clinical Impression(s) / ED Diagnoses Final diagnoses:  None    Rx / DC Orders ED Discharge Orders     None         Kinzie Wickes, MD 11/24/21 [id number]

## 2021-11-24 NOTE — ED Notes (Signed)
Per [employer] rep Adam, no acute or abnormal events picked up on interrogation report

## 2021-11-25 ENCOUNTER — Encounter (HOSPITAL_BASED_OUTPATIENT_CLINIC_OR_DEPARTMENT_OTHER): Payer: Self-pay | Admitting: Emergency Medicine
